# Patient Record
Sex: Female | Born: 1965 | Race: White | Hispanic: No | Marital: Single | Smoking: Current every day smoker
Health system: Southern US, Community
[De-identification: ages and names within clinical notes are randomized; demographics above are authoritative.]

## PROBLEM LIST (undated history)

## (undated) DIAGNOSIS — K759 Inflammatory liver disease, unspecified: Secondary | ICD-10-CM

## (undated) HISTORY — DX: Inflammatory liver disease, unspecified: K75.9

## (undated) HISTORY — PX: NO PAST SURGERIES: SHX2092

---

## 2017-12-24 ENCOUNTER — Other Ambulatory Visit: Payer: Self-pay

## 2017-12-24 ENCOUNTER — Emergency Department (HOSPITAL_COMMUNITY): Payer: Medicare Other

## 2017-12-24 ENCOUNTER — Encounter (HOSPITAL_COMMUNITY): Payer: Self-pay | Admitting: Emergency Medicine

## 2017-12-24 ENCOUNTER — Observation Stay (HOSPITAL_COMMUNITY)
Admission: EM | Admit: 2017-12-24 | Discharge: 2017-12-26 | Disposition: A | Discharge status: Home / Self Care | Payer: Medicare Other | Attending: Family Medicine | Admitting: Family Medicine

## 2017-12-24 DIAGNOSIS — T6591XA Toxic effect of unspecified substance, accidental (unintentional), initial encounter: Secondary | ICD-10-CM | POA: Diagnosis not present

## 2017-12-24 DIAGNOSIS — R413 Other amnesia: Secondary | ICD-10-CM | POA: Diagnosis not present

## 2017-12-24 DIAGNOSIS — F1910 Other psychoactive substance abuse, uncomplicated: Secondary | ICD-10-CM | POA: Diagnosis not present

## 2017-12-24 DIAGNOSIS — N179 Acute kidney failure, unspecified: Secondary | ICD-10-CM | POA: Diagnosis present

## 2017-12-24 DIAGNOSIS — D649 Anemia, unspecified: Secondary | ICD-10-CM | POA: Insufficient documentation

## 2017-12-24 DIAGNOSIS — D72829 Elevated white blood cell count, unspecified: Secondary | ICD-10-CM | POA: Diagnosis not present

## 2017-12-24 DIAGNOSIS — N390 Urinary tract infection, site not specified: Secondary | ICD-10-CM | POA: Insufficient documentation

## 2017-12-24 DIAGNOSIS — Y92003 Bedroom of unspecified non-institutional (private) residence as the place of occurrence of the external cause: Secondary | ICD-10-CM | POA: Diagnosis not present

## 2017-12-24 DIAGNOSIS — D696 Thrombocytopenia, unspecified: Secondary | ICD-10-CM | POA: Diagnosis present

## 2017-12-24 DIAGNOSIS — T50901A Poisoning by unspecified drugs, medicaments and biological substances, accidental (unintentional), initial encounter: Secondary | ICD-10-CM

## 2017-12-24 DIAGNOSIS — G9340 Encephalopathy, unspecified: Secondary | ICD-10-CM | POA: Diagnosis present

## 2017-12-24 LAB — I-STAT CHEM 8, ED
BUN: 18 mg/dL (ref 6–20)
Calcium, Ion: 1.1 mmol/L — ABNORMAL LOW (ref 1.15–1.40)
Chloride: 108 mmol/L (ref 98–111)
Creatinine, Ser: 1.2 mg/dL — ABNORMAL HIGH (ref 0.44–1.00)
Glucose, Bld: 179 mg/dL — ABNORMAL HIGH (ref 70–99)
HCT: 37 % (ref 36.0–46.0)
Hemoglobin: 12.6 g/dL (ref 12.0–15.0)
Potassium: 3.9 mmol/L (ref 3.5–5.1)
Sodium: 141 mmol/L (ref 135–145)
TCO2: 24 mmol/L (ref 22–32)

## 2017-12-24 LAB — URINALYSIS, ROUTINE W REFLEX MICROSCOPIC
Bilirubin Urine: NEGATIVE
Glucose, UA: NEGATIVE mg/dL
Hgb urine dipstick: NEGATIVE
Ketones, ur: 5 mg/dL — AB
Nitrite: NEGATIVE
Protein, ur: 30 mg/dL — AB
Specific Gravity, Urine: 1.021 (ref 1.005–1.030)
pH: 5 (ref 5.0–8.0)

## 2017-12-24 LAB — RAPID URINE DRUG SCREEN, HOSP PERFORMED
Amphetamines: NOT DETECTED
Barbiturates: NOT DETECTED
Benzodiazepines: POSITIVE — AB
Cocaine: NOT DETECTED
Opiates: POSITIVE — AB
Tetrahydrocannabinol: NOT DETECTED

## 2017-12-24 LAB — CBC WITH DIFFERENTIAL/PLATELET
Abs Immature Granulocytes: 0.11 10*3/uL — ABNORMAL HIGH (ref 0.00–0.07)
Basophils Absolute: 0 10*3/uL (ref 0.0–0.1)
Basophils Relative: 0 %
Eosinophils Absolute: 0.1 10*3/uL (ref 0.0–0.5)
Eosinophils Relative: 1 %
HCT: 40.3 % (ref 36.0–46.0)
Hemoglobin: 12.4 g/dL (ref 12.0–15.0)
Immature Granulocytes: 1 %
Lymphocytes Relative: 7 %
Lymphs Abs: 0.8 10*3/uL (ref 0.7–4.0)
MCH: 30 pg (ref 26.0–34.0)
MCHC: 30.8 g/dL (ref 30.0–36.0)
MCV: 97.6 fL (ref 80.0–100.0)
Monocytes Absolute: 0.5 10*3/uL (ref 0.1–1.0)
Monocytes Relative: 4 %
Neutro Abs: 10.2 10*3/uL — ABNORMAL HIGH (ref 1.7–7.7)
Neutrophils Relative %: 87 %
Platelets: 113 10*3/uL — ABNORMAL LOW (ref 150–400)
RBC: 4.13 MIL/uL (ref 3.87–5.11)
RDW: 14.2 % (ref 11.5–15.5)
WBC: 11.7 10*3/uL — ABNORMAL HIGH (ref 4.0–10.5)
nRBC: 0 % (ref 0.0–0.2)

## 2017-12-24 LAB — CBG MONITORING, ED: Glucose-Capillary: 182 mg/dL — ABNORMAL HIGH (ref 70–99)

## 2017-12-24 LAB — I-STAT TROPONIN, ED: Troponin i, poc: 0 ng/mL (ref 0.00–0.08)

## 2017-12-24 LAB — I-STAT BETA HCG BLOOD, ED (MC, WL, AP ONLY): I-stat hCG, quantitative: <5 m[IU]/mL (ref <5)

## 2017-12-24 LAB — I-STAT CG4 LACTIC ACID, ED: Lactic Acid, Venous: 2.4 mmol/L (ref 0.5–1.9)

## 2017-12-24 LAB — SALICYLATE LEVEL: Salicylate Lvl: <7 mg/dL (ref 2.8–30.0)

## 2017-12-24 LAB — ACETAMINOPHEN LEVEL: Acetaminophen (Tylenol), Serum: <10 ug/mL — ABNORMAL LOW (ref 10–30)

## 2017-12-24 LAB — ETHANOL: Alcohol, Ethyl (B): <10 mg/dL (ref <10)

## 2017-12-24 MED ORDER — LORAZEPAM 2 MG/ML IJ SOLN
INTRAMUSCULAR | Status: AC
  Administered 2017-12-25
  Filled 2017-12-24: qty 1

## 2017-12-24 MED ORDER — HALOPERIDOL LACTATE 5 MG/ML IJ SOLN
INTRAMUSCULAR | Status: AC
  Filled 2017-12-24: qty 1

## 2017-12-24 MED ORDER — LORAZEPAM 2 MG/ML IJ SOLN
INTRAMUSCULAR | Status: AC
  Administered 2017-12-24: 2 mg
  Filled 2017-12-24: qty 1

## 2017-12-24 MED ORDER — SODIUM CHLORIDE 0.9 % IV SOLN
1.0000 g | Freq: Once | INTRAVENOUS | Status: AC
  Administered 2017-12-25: 1 g via INTRAVENOUS
  Filled 2017-12-24: qty 10

## 2017-12-24 MED ORDER — HALOPERIDOL LACTATE 5 MG/ML IJ SOLN
5.0000 mg | Freq: Once | INTRAMUSCULAR | Status: AC
  Administered 2017-12-24: 5 mg via INTRAVENOUS

## 2017-12-24 MED ORDER — NALOXONE HCL 2 MG/2ML IJ SOSY
PREFILLED_SYRINGE | INTRAMUSCULAR | Status: AC
  Administered 2017-12-24: 2 mg
  Filled 2017-12-24: qty 2

## 2017-12-24 MED ORDER — SODIUM CHLORIDE 0.9 % IV BOLUS
500.0000 mL | Freq: Once | INTRAVENOUS | Status: AC
  Administered 2017-12-24: 500 mL via INTRAVENOUS

## 2017-12-24 NOTE — ED Notes (Signed)
Due to patient's bariatric habitus/gurney cuffs applied instead of soft restraints

## 2017-12-24 NOTE — ED Notes (Signed)
Notified EDP,Zammit,MD. And RN,Bobby made aware of pt. CG4 Lactic acid results 2.40.

## 2017-12-24 NOTE — ED Triage Notes (Signed)
Pt found down at home in her own emesis suspected overdose report per EMS that pt overdosed on iunknown substance

## 2017-12-24 NOTE — ED Notes (Signed)
CT notified that pt is approprate for scanning at this time.

## 2017-12-24 NOTE — ED Notes (Signed)
Bed: RESB Expected date:  Expected time:  Means of arrival:  Comments: EMS overdose RR 60 with NPA/NRB zanidine and track marks

## 2017-12-24 NOTE — ED Notes (Signed)
Patient transported to CT 

## 2017-12-25 ENCOUNTER — Observation Stay (HOSPITAL_COMMUNITY)
Admit: 2017-12-25 | Discharge: 2017-12-25 | Disposition: A | Discharge status: Home / Self Care | Payer: Medicare Other | Attending: Internal Medicine | Admitting: Internal Medicine

## 2017-12-25 ENCOUNTER — Other Ambulatory Visit: Payer: Self-pay

## 2017-12-25 ENCOUNTER — Encounter (HOSPITAL_COMMUNITY): Payer: Self-pay | Admitting: Internal Medicine

## 2017-12-25 DIAGNOSIS — G9340 Encephalopathy, unspecified: Secondary | ICD-10-CM | POA: Diagnosis present

## 2017-12-25 DIAGNOSIS — D696 Thrombocytopenia, unspecified: Secondary | ICD-10-CM | POA: Diagnosis present

## 2017-12-25 DIAGNOSIS — N179 Acute kidney failure, unspecified: Secondary | ICD-10-CM | POA: Diagnosis present

## 2017-12-25 DIAGNOSIS — T6591XA Toxic effect of unspecified substance, accidental (unintentional), initial encounter: Secondary | ICD-10-CM | POA: Diagnosis not present

## 2017-12-25 DIAGNOSIS — T50901A Poisoning by unspecified drugs, medicaments and biological substances, accidental (unintentional), initial encounter: Secondary | ICD-10-CM | POA: Diagnosis present

## 2017-12-25 LAB — CBC WITH DIFFERENTIAL/PLATELET
Abs Immature Granulocytes: 0.12 10*3/uL — ABNORMAL HIGH (ref 0.00–0.07)
Basophils Absolute: 0 10*3/uL (ref 0.0–0.1)
Basophils Relative: 0 %
Eosinophils Absolute: 0 10*3/uL (ref 0.0–0.5)
Eosinophils Relative: 0 %
HCT: 38.3 % (ref 36.0–46.0)
Hemoglobin: 11.9 g/dL — ABNORMAL LOW (ref 12.0–15.0)
Immature Granulocytes: 1 %
Lymphocytes Relative: 6 %
Lymphs Abs: 0.8 10*3/uL (ref 0.7–4.0)
MCH: 30.7 pg (ref 26.0–34.0)
MCHC: 31.1 g/dL (ref 30.0–36.0)
MCV: 98.7 fL (ref 80.0–100.0)
Monocytes Absolute: 0.5 10*3/uL (ref 0.1–1.0)
Monocytes Relative: 3 %
Neutro Abs: 12.8 10*3/uL — ABNORMAL HIGH (ref 1.7–7.7)
Neutrophils Relative %: 90 %
Platelets: 142 10*3/uL — ABNORMAL LOW (ref 150–400)
RBC: 3.88 MIL/uL (ref 3.87–5.11)
RDW: 14 % (ref 11.5–15.5)
WBC: 14.3 10*3/uL — ABNORMAL HIGH (ref 4.0–10.5)
nRBC: 0 % (ref 0.0–0.2)

## 2017-12-25 LAB — BLOOD GAS, ARTERIAL
Acid-Base Excess: 0.2 mmol/L (ref 0.0–2.0)
Bicarbonate: 24.1 mmol/L (ref 20.0–28.0)
Drawn by: 23532
FIO2: 21
O2 Saturation: 78.2 %
Patient temperature: 98.6
pCO2 arterial: 38.3 mmHg (ref 32.0–48.0)
pH, Arterial: 7.414 (ref 7.350–7.450)
pO2, Arterial: 42.1 mmHg — ABNORMAL LOW (ref 83.0–108.0)

## 2017-12-25 LAB — COMPREHENSIVE METABOLIC PANEL
ALT: 20 U/L (ref 0–44)
AST: 25 U/L (ref 15–41)
Albumin: 2.9 g/dL — ABNORMAL LOW (ref 3.5–5.0)
Alkaline Phosphatase: 65 U/L (ref 38–126)
Anion gap: 12 (ref 5–15)
BUN: 17 mg/dL (ref 6–20)
CO2: 22 mmol/L (ref 22–32)
Calcium: 8 mg/dL — ABNORMAL LOW (ref 8.9–10.3)
Chloride: 98 mmol/L (ref 98–111)
Creatinine, Ser: 1.05 mg/dL — ABNORMAL HIGH (ref 0.44–1.00)
GFR calc Af Amer: >60 mL/min (ref >60)
GFR calc non Af Amer: 60 mL/min — ABNORMAL LOW (ref >60)
Glucose, Bld: 130 mg/dL — ABNORMAL HIGH (ref 70–99)
Potassium: 4.1 mmol/L (ref 3.5–5.1)
Sodium: 132 mmol/L — ABNORMAL LOW (ref 135–145)
Total Bilirubin: 0.5 mg/dL (ref 0.3–1.2)
Total Protein: 6 g/dL — ABNORMAL LOW (ref 6.5–8.1)

## 2017-12-25 LAB — GLUCOSE, CAPILLARY: Glucose-Capillary: 84 mg/dL (ref 70–99)

## 2017-12-25 LAB — PATHOLOGIST SMEAR REVIEW

## 2017-12-25 LAB — TROPONIN I
Troponin I: 0.04 ng/mL
Troponin I: 0.03 ng/mL
Troponin I: <0.03 ng/mL

## 2017-12-25 LAB — HEPATIC FUNCTION PANEL
ALT: 24 U/L (ref 0–44)
AST: 31 U/L (ref 15–41)
Albumin: 3.3 g/dL — ABNORMAL LOW (ref 3.5–5.0)
Alkaline Phosphatase: 75 U/L (ref 38–126)
Bilirubin, Direct: 0.2 mg/dL (ref 0.0–0.2)
Indirect Bilirubin: 0.4 mg/dL (ref 0.3–0.9)
Total Bilirubin: 0.6 mg/dL (ref 0.3–1.2)
Total Protein: 6.8 g/dL (ref 6.5–8.1)

## 2017-12-25 LAB — LACTIC ACID, PLASMA
Lactic Acid, Venous: 2.1 mmol/L (ref 0.5–1.9)
Lactic Acid, Venous: 1.7 mmol/L (ref 0.5–1.9)

## 2017-12-25 LAB — MAGNESIUM: Magnesium: 1.7 mg/dL (ref 1.7–2.4)

## 2017-12-25 LAB — BASIC METABOLIC PANEL
Anion gap: 10 (ref 5–15)
BUN: 19 mg/dL (ref 6–20)
CO2: 24 mmol/L (ref 22–32)
Calcium: 8.7 mg/dL — ABNORMAL LOW (ref 8.9–10.3)
Chloride: 105 mmol/L (ref 98–111)
Creatinine, Ser: 1.13 mg/dL — ABNORMAL HIGH (ref 0.44–1.00)
GFR calc Af Amer: >60 mL/min (ref >60)
GFR calc non Af Amer: 55 mL/min — ABNORMAL LOW (ref >60)
Glucose, Bld: 137 mg/dL — ABNORMAL HIGH (ref 70–99)
Potassium: 4.4 mmol/L (ref 3.5–5.1)
Sodium: 139 mmol/L (ref 135–145)

## 2017-12-25 LAB — AMMONIA: Ammonia: 28 umol/L (ref 9–35)

## 2017-12-25 LAB — TSH: TSH: 4.478 u[IU]/mL (ref 0.350–4.500)

## 2017-12-25 LAB — HEMOGLOBIN A1C
Hgb A1c MFr Bld: 4.9 % (ref 4.8–5.6)
Mean Plasma Glucose: 93.93 mg/dL

## 2017-12-25 LAB — MRSA PCR SCREENING: MRSA by PCR: NEGATIVE

## 2017-12-25 LAB — HIV ANTIBODY (ROUTINE TESTING W REFLEX): HIV Screen 4th Generation wRfx: NONREACTIVE

## 2017-12-25 MED ORDER — FOLIC ACID 5 MG/ML IJ SOLN
1.0000 mg | Freq: Every day | INTRAMUSCULAR | Status: DC
  Administered 2017-12-25: 1 mg via INTRAVENOUS
  Filled 2017-12-25 (×2): qty 0.2

## 2017-12-25 MED ORDER — THIAMINE HCL 100 MG/ML IJ SOLN
100.0000 mg | Freq: Every day | INTRAMUSCULAR | Status: DC
  Administered 2017-12-25: 100 mg via INTRAVENOUS
  Filled 2017-12-25: qty 2

## 2017-12-25 MED ORDER — SODIUM CHLORIDE 0.9 % IV SOLN
1.0000 g | INTRAVENOUS | Status: DC
  Administered 2017-12-25: 1 g via INTRAVENOUS
  Filled 2017-12-25: qty 1

## 2017-12-25 MED ORDER — SODIUM CHLORIDE 0.9 % IV BOLUS
1000.0000 mL | Freq: Once | INTRAVENOUS | Status: AC
  Administered 2017-12-25: 1000 mL via INTRAVENOUS

## 2017-12-25 MED ORDER — ACETAMINOPHEN 325 MG PO TABS: 650.0000 mg | ORAL_TABLET | Freq: Four times a day (QID) | ORAL | Status: DC | PRN

## 2017-12-25 MED ORDER — LORAZEPAM 2 MG/ML IJ SOLN: 2.0000 mg | INTRAMUSCULAR | Status: DC | PRN

## 2017-12-25 MED ORDER — SODIUM CHLORIDE 0.9 % IV SOLN
INTRAVENOUS | Status: AC
  Administered 2017-12-25 (×3): via INTRAVENOUS

## 2017-12-25 MED ORDER — ACETAMINOPHEN 650 MG RE SUPP: 650.0000 mg | Freq: Four times a day (QID) | RECTAL | Status: DC | PRN

## 2017-12-25 NOTE — ED Notes (Signed)
Pt to CT scan via stretcher medicated with ativan 2 mg IV for agitation

## 2017-12-25 NOTE — Progress Notes (Signed)
EEG complete - results pending 

## 2017-12-25 NOTE — Progress Notes (Signed)
PROGRESS NOTE    Jade Burgess  ZOX:[id number] DOB: 12/20/1965 DOA: 12/24/2017 PCP: Patient, No Pcp Per      Brief Narrative:  Jade Burgess is a 52 y.o. F with hx IVDU presented with being found down, unconscious.  Per notes, EMS gave Narcan after which she woke up, was aggressive, given Haldol.  UDS positive for opiates, benzos only.  Aceta/salic/alcohol negative.  Lactic acid elevated, WBC 11.7K only, no fever, no reports of recent malaise, illness.  UA non-descript, CXR clear.   Of note, patient's boyfriend found at same address, deceased from apparent overdose.     Assessment & Plan:  Suspected overdose Patient and son deny suicidal ideation.   Patient also however denies overdose.  She does not know about her boyfriend's death, son has requested that he be able to break this news later. -Monitor on tele -Continue IV fluids and supportive care -Consult to Psych for safety eval given overdose   Leukocytosis Presumed reactive. At this point, no reason to suspect infection.  Substance use disorder -Continue CIWA -Continue folate, thiamine     DVT prophylaxis: SCDs Code Status: FULL Family Communication: Son at bedside MDM and disposition Plan: This is a no charge note.  For further details, please see H&P by my partner Dr. Toniann Fail from earlier today.  The below labs and imaging reports were reviewed and summarized above.    The patient was admitted with acute change in mentation.  Suspected overdose.  Will observe overnight, if normal tomorrow, no developing signs of infection, and able to contract for safety with Psych, will dishcarge tomorrow.    Objective: Vitals:   12/25/17 0500 12/25/17 0600 12/25/17 0700 12/25/17 0800  BP: 117/77 122/69 (!) 102/50 (!) 117/59  Pulse: 80 83 77 79  Resp: (!) 28 (!) 28 (!) 24 (!) 24  Temp: 98.2 F (36.8 C) 98.4 F (36.9 C) 98.6 F (37 C) 98.4 F (36.9 C)  TempSrc:    Core  SpO2: 96% 95% 96% 96%  Weight:      Height:          Intake/Output Summary (Last 24 hours) at 12/25/2017 0921 Last data filed at 12/25/2017 0600 Gross per 24 hour  Intake 408.56 ml  Output 475 ml  Net -66.44 ml   Filed Weights   12/25/17 0300  Weight: 117.8 kg    Examination: The patient was seen and examined.      Data Reviewed: I have personally reviewed following labs and imaging studies:  CBC: Recent Labs  Lab 12/24/17 2256 12/24/17 2306 12/25/17 0250  WBC 11.7*  --  14.3*  NEUTROABS 10.2*  --  12.8*  HGB 12.4 12.6 11.9*  HCT 40.3 37.0 38.3  MCV 97.6  --  98.7  PLT 113*  --  142*   Basic Metabolic Panel: Recent Labs  Lab 12/24/17 2256 12/24/17 2306 12/25/17 0250  NA 132* 141 139  K 4.1 3.9 4.4  CL 98 108 105  CO2 22  --  24  GLUCOSE 130* 179* 137*  BUN 17 18 19   CREATININE 1.05* 1.20* 1.13*  CALCIUM 8.0*  --  8.7*  MG  --   --  1.7   GFR: Estimated Creatinine Clearance: 71 mL/min (A) (by C-G formula based on SCr of 1.13 mg/dL (H)). Liver Function Tests: Recent Labs  Lab 12/24/17 2256 12/25/17 0250  AST 25 31  ALT 20 24  ALKPHOS 65 75  BILITOT 0.5 0.6  PROT 6.0* 6.8  ALBUMIN 2.9*  3.3*   No results for input(s): LIPASE, AMYLASE in the last 168 hours. No results for input(s): AMMONIA in the last 168 hours. Coagulation Profile: No results for input(s): INR, PROTIME in the last 168 hours. Cardiac Enzymes: Recent Labs  Lab 12/25/17 0250  TROPONINI 0.04*   BNP (last 3 results) No results for input(s): PROBNP in the last 8760 hours. HbA1C: Recent Labs    12/25/17 0250  HGBA1C 4.9   CBG: Recent Labs  Lab 12/24/17 2217  GLUCAP 182*   Lipid Profile: No results for input(s): CHOL, HDL, LDLCALC, TRIG, CHOLHDL, LDLDIRECT in the last 72 hours. Thyroid Function Tests: Recent Labs    12/25/17 0250  TSH 4.478   Anemia Panel: No results for input(s): VITAMINB12, FOLATE, FERRITIN, TIBC, IRON, RETICCTPCT in the last 72 hours. Urine analysis:    Component Value Date/Time   COLORURINE  AMBER (A) 12/24/2017 2226   APPEARANCEUR HAZY (A) 12/24/2017 2226   LABSPEC 1.021 12/24/2017 2226   PHURINE 5.0 12/24/2017 2226   GLUCOSEU NEGATIVE 12/24/2017 2226   HGBUR NEGATIVE 12/24/2017 2226   BILIRUBINUR NEGATIVE 12/24/2017 2226   KETONESUR 5 (A) 12/24/2017 2226   PROTEINUR 30 (A) 12/24/2017 2226   NITRITE NEGATIVE 12/24/2017 2226   LEUKOCYTESUR MODERATE (A) 12/24/2017 2226   Sepsis Labs: @LABRCNTIP (procalcitonin:4,lacticacidven:4)  ) Recent Results (from the past 240 hour(s))  MRSA PCR Screening     Status: None   Collection Time: 12/25/17  2:19 AM  Result Value Ref Range Status   MRSA by PCR NEGATIVE NEGATIVE Final    Comment:        The GeneXpert MRSA Assay (FDA approved for NASAL specimens only), is one component of a comprehensive MRSA colonization surveillance program. It is not intended to diagnose MRSA infection nor to guide or monitor treatment for MRSA infections. Performed at [facility], [address] [place], [place] [address]          Radiology Studies: Ct Head Wo Contrast  Result Date: 12/25/2017 CLINICAL DATA:  Patient was found down. Possible overdose. EXAM: CT HEAD WITHOUT CONTRAST TECHNIQUE: Contiguous axial images were obtained from the base of the skull through the vertex without intravenous contrast. COMPARISON:  None. FINDINGS: Brain: Mild cerebral atrophy for age. No ventricular dilatation. No mass effect or midline shift. No abnormal extra-axial fluid collections. Gray-white matter junctions are distinct. Basal cisterns are not effaced. No acute intracranial hemorrhage. Vascular: No hyperdense vessel or unexpected calcification. Skull: Calvarium appears intact. Sinuses/Orbits: Paranasal sinuses demonstrate mild mucosal thickening in the maxillary antra. No acute air-fluid levels. Hypoaeration of the mastoid air cells without opacification. Other: None. IMPRESSION: No acute intracranial abnormalities.  Mild cerebral  atrophy. Electronically Signed   By: Burman Nieves M.D.   On: 12/25/2017 00:35   Dg Chest Port 1 View  Result Date: 12/24/2017 CLINICAL DATA:  Patient found down at home.  Suspected overdose. EXAM: PORTABLE CHEST 1 VIEW COMPARISON:  None. FINDINGS: Shallow inspiration. Normal heart size and pulmonary vascularity. No focal airspace disease or consolidation in the lungs. No blunting of costophrenic angles. No pneumothorax. Mediastinal contours appear intact. IMPRESSION: No active disease. Electronically Signed   By: Burman Nieves M.D.   On: 12/24/2017 23:04        Scheduled Meds: . folic acid  1 mg Intravenous Daily  . haloperidol lactate      . thiamine injection  100 mg Intravenous Daily   Continuous Infusions: . sodium chloride 125 mL/hr at 12/25/17 0600  . cefTRIAXone (  ROCEPHIN)  IV       LOS: 0 days    Time spent: 15 minutes    Alberteen Sam, MD [facility] 12/25/2017, 9:21 AM     Pager [phone] --- please page though AMION:  [email or web address] If 7PM-7AM, please contact night-coverage

## 2017-12-25 NOTE — ED Provider Notes (Signed)
[facility]-EMERGENCY DEPT Provider Note   CSN: [id number] Arrival date & time: 12/24/17  2203     History   Chief Complaint Chief Complaint  Patient presents with  . Drug Overdose    HPI Jade Burgess is a 52 y.o. female.  Patient is a known IV drug user.  Her son found her obtunded in the bed and she had vomited.  Additional history when the son went back to the house to try to find the patient's driver's license he found the boyfriend unresponsive in a car.  He had overdosed in the car and resuscitated efforts failed and the boyfriend died  The history is provided by a relative. No language interpreter was used.  Altered Mental Status   This is a new problem. Episode onset: Unknown. The problem has not changed since onset.Associated symptoms include confusion. Risk factors include illicit drug use. Her past medical history does not include seizures.    History reviewed. No pertinent past medical history.  There are no active problems to display for this patient.   History reviewed. No pertinent surgical history.   OB History   None      Home Medications    Prior to Admission medications   Not on File    Family History History reviewed. No pertinent family history.  Social History Social History   Tobacco Use  . Smoking status: Unknown If Ever Smoked  Substance Use Topics  . Alcohol use: Not on file  . Drug use: Not on file     Allergies   Patient has no known allergies.   Review of Systems Review of Systems  Unable to perform ROS: Mental status change  Psychiatric/Behavioral: Positive for confusion.     Physical Exam Updated Vital Signs BP (!) 160/86   Pulse (!) 113   Temp 97.7 F (36.5 C)   Resp (!) 25   LMP  (LMP Unknown)   SpO2 97%   Physical Exam  Constitutional: She appears well-developed.  HENT:  Head: Normocephalic.  Eyes: Conjunctivae and EOM are normal. No scleral icterus.  Neck: Neck supple. No  thyromegaly present.  Cardiovascular: Regular rhythm. Exam reveals no gallop and no friction rub.  No murmur heard. Tachycardic  Pulmonary/Chest: No stridor. She has no wheezes. She has no rales. She exhibits no tenderness.  Few crackles throughout  Abdominal: She exhibits no distension. There is no tenderness. There is no rebound.  Musculoskeletal: Normal range of motion. She exhibits no edema.  Lymphadenopathy:    She has no cervical adenopathy.  Neurological: She exhibits normal muscle tone. Coordination normal.  Patient extremely confused.  She is screaming and moving all her extremities.  Patient will not follow commands at all.  Patient is protecting her airway.  Skin: No rash noted. No erythema.     ED Treatments / Results  Labs (all labs ordered are listed, but only abnormal results are displayed) Labs Reviewed  CBC WITH DIFFERENTIAL/PLATELET - Abnormal; Notable for the following components:      Result Value   WBC 11.7 (*)    Platelets 113 (*)    Neutro Abs 10.2 (*)    Abs Immature Granulocytes 0.11 (*)    All other components within normal limits  COMPREHENSIVE METABOLIC PANEL - Abnormal; Notable for the following components:   Sodium 132 (*)    Glucose, Bld 130 (*)    Creatinine, Ser 1.05 (*)    Calcium 8.0 (*)    Total Protein 6.0 (*)  Albumin 2.9 (*)    GFR calc non Af Amer 60 (*)    All other components within normal limits  URINALYSIS, ROUTINE W REFLEX MICROSCOPIC - Abnormal; Notable for the following components:   Color, Urine AMBER (*)    APPearance HAZY (*)    Ketones, ur 5 (*)    Protein, ur 30 (*)    Leukocytes, UA MODERATE (*)    Bacteria, UA FEW (*)    All other components within normal limits  RAPID URINE DRUG SCREEN, HOSP PERFORMED - Abnormal; Notable for the following components:   Opiates POSITIVE (*)    Benzodiazepines POSITIVE (*)    All other components within normal limits  ACETAMINOPHEN LEVEL - Abnormal; Notable for the following  components:   Acetaminophen (Tylenol), Serum <10 (*)    All other components within normal limits  I-STAT CHEM 8, ED - Abnormal; Notable for the following components:   Creatinine, Ser 1.20 (*)    Glucose, Bld 179 (*)    Calcium, Ion 1.10 (*)    All other components within normal limits  I-STAT CG4 LACTIC ACID, ED - Abnormal; Notable for the following components:   Lactic Acid, Venous 2.40 (*)    All other components within normal limits  CBG MONITORING, ED - Abnormal; Notable for the following components:   Glucose-Capillary 182 (*)    All other components within normal limits  URINE CULTURE  ETHANOL  SALICYLATE LEVEL  I-STAT TROPONIN, ED  I-STAT BETA HCG BLOOD, ED ([facility], [facility], [facility] ONLY)  I-STAT CG4 LACTIC ACID, ED    EKG None  Radiology Dg Chest Port 1 View  Result Date: 12/24/2017 CLINICAL DATA:  Patient found down at home.  Suspected overdose. EXAM: PORTABLE CHEST 1 VIEW COMPARISON:  None. FINDINGS: Shallow inspiration. Normal heart size and pulmonary vascularity. No focal airspace disease or consolidation in the lungs. No blunting of costophrenic angles. No pneumothorax. Mediastinal contours appear intact. IMPRESSION: No active disease. Electronically Signed   By: Burman Nieves M.D.   On: 12/24/2017 23:04    Procedures Procedures (including critical care time)  Medications Ordered in ED Medications  haloperidol lactate (HALDOL) 5 MG/ML injection (  Not Given 12/24/17 2315)  cefTRIAXone (ROCEPHIN) 1 g in sodium chloride 0.9 % 100 mL IVPB (has no administration in time range)  LORazepam (ATIVAN) 2 MG/ML injection (has no administration in time range)  sodium chloride 0.9 % bolus 1,000 mL (has no administration in time range)  naloxone [facility]) 2 MG/2ML injection (2 mg  Given 12/24/17 2208)  sodium chloride 0.9 % bolus 500 mL (500 mLs Intravenous New Bag/Given 12/24/17 2253)  LORazepam (ATIVAN) 2 MG/ML injection (2 mg  Given 12/24/17 2229)  haloperidol lactate (HALDOL)  injection 5 mg (5 mg Intravenous Given 12/24/17 2241)  LORazepam (ATIVAN) 2 MG/ML injection (2 mg  Given 12/24/17 2315)     Initial Impression / Assessment and Plan / ED Course  I have reviewed the triage vital signs and the nursing notes.  Pertinent labs & imaging results that were available during my care of the patient were reviewed by me and considered in my medical decision making (see chart for details).     CRITICAL CARE Performed by: Bethann Berkshire Total critical care time: 70 minutes Critical care time was exclusive of separately billable procedures and treating other patients. Critical care was necessary to treat or prevent imminent or life-threatening deterioration. Critical care was time spent personally by me on the following activities: development of treatment plan  with patient and/or surrogate as well as nursing, discussions with consultants, evaluation of patient's response to treatment, examination of patient, obtaining history from patient or surrogate, ordering and performing treatments and interventions, ordering and review of laboratory studies, ordering and review of radiographic studies, pulse oximetry and re-evaluation of patient's condition        patient was given approximately 6 mg of Ativan and 5 mg of Haldol to sedate her such as she can get her labs and CT scans done.  Labs are unremarkable with lactic acid 2.4.  Urinalysis is positive for benzos and opiates patient also has a urinary tract infection.  Chest x-ray unremarkable.  CT scan pending.  Patient with urinary tract infection and confusion from most likely drug overdose.  She will be admitted to the ICU Final Clinical Impressions(s) / ED Diagnoses   Final diagnoses:  None    ED Discharge Orders    None       Bethann Berkshire, MD 12/25/17 1232

## 2017-12-25 NOTE — Care Management Note (Signed)
Case Management Note  Patient Details  Name: Mykelle Cockerell MRN: [id number] Date of Birth: 04-12-1965  Subjective/Objective:        Jarratt at 21%,tropopin 0.24,wcb 14.3, lactic acid 2.1, iv ns at 125cc/hrs, iv rocephin            Action/Plan: Following for cm needs  Expected Discharge Date:  (unknown)               Expected Discharge Plan:  Home/Self Care  In-House Referral:     Discharge planning Services  CM Consult  Post Acute Care Choice:    Choice offered to:     DME Arranged:    DME Agency:     HH Arranged:    HH Agency:     Status of Service:  In process, will continue to follow  If discussed at Long Length of Stay Meetings, dates discussed:    Additional Comments:  Golda Acre, RN 12/25/2017, 9:57 AM

## 2017-12-25 NOTE — H&P (Addendum)
History and Physical    Jade Burgess ZOX:[id number] DOB: Apr 29, 1965 DOA: 12/24/2017  PCP: Patient, No Pcp Per   Patient coming from: Home.  History obtained from ER physician and patient's nurse.  Patient is encephalopathic.  No family available at this time no contact number available.  Chief Complaint: Confusion.  HPI: Jade Burgess is a 52 y.o. female with history of IV drug abuse was brought to the ER by patient's son after patient was found to be unconscious at home and was lying on the floor.  Not sure how long patient was lying on the floor.  Patient was brought to the ER after which.  The ER physician stated that when patient's son went back to check patient ID at home patient's boyfriend was lying dead on the driveway.  It is not known what drug the patient or patient's boyfriend was taking.  ED Course: In the ER patient became more aggressive and encephalopathic.  Patient was initially given Narcan but has to be soon given Haldol and at least 6 mg of IV Ativan for patient becoming more aggressive.  CT head was unremarkable patient was afebrile.  Urine drug screen is positive for opiates and benzodiazepine.  EKG is pending.  Acetaminophen level and salicylate levels were negative.  Lab work show mild leukocytosis and thrombocytopenia and mildly elevated creatinine.  No other labs to compare.  Albumin level is low.  Chest x-ray is unremarkable.  UA shows features concerning for UTI.  Review of Systems: As per HPI, rest all negative.   History reviewed. No pertinent past medical history.  History reviewed. No pertinent surgical history.   has an unknown smoking status. She has never used smokeless tobacco. Her alcohol and drug histories are not on file.  No Known Allergies  Family History  Family history unknown: Yes    Prior to Admission medications   Not on File    Physical Exam: Vitals:   12/25/17 0000 12/25/17 0030 12/25/17 0100 12/25/17 0130  BP: (!) 145/103  120/80 131/71 128/69  Pulse: (!) 113 89 90 92  Resp: (!) 36 (!) 28 (!) 30 (!) 29  Temp:  98.1 F (36.7 C) 98.1 F (36.7 C) 98.2 F (36.8 C)  TempSrc:      SpO2: 97% 91% 91% 93%      Constitutional: Moderately built and nourished. Vitals:   12/25/17 0000 12/25/17 0030 12/25/17 0100 12/25/17 0130  BP: (!) 145/103 120/80 131/71 128/69  Pulse: (!) 113 89 90 92  Resp: (!) 36 (!) 28 (!) 30 (!) 29  Temp:  98.1 F (36.7 C) 98.1 F (36.7 C) 98.2 F (36.8 C)  TempSrc:      SpO2: 97% 91% 91% 93%   Eyes: Anicteric no pallor. ENMT: No discharge from the ears eyes nose or mouth. Neck: No neck rigidity no mass felt. Respiratory: No rhonchi or crepitations. Cardiovascular: S1-S2 heard no murmurs appreciated. Abdomen: Soft nontender bowel sounds present. Musculoskeletal: No edema.  No joint effusion. Skin: No rash. Neurologic: Encephalopathic does not follow commands. Psychiatric: Encephalopathic does not follow commands.   Labs on Admission: I have personally reviewed following labs and imaging studies  CBC: Recent Labs  Lab 12/24/17 2256 12/24/17 2306  WBC 11.7*  --   NEUTROABS 10.2*  --   HGB 12.4 12.6  HCT 40.3 37.0  MCV 97.6  --   PLT 113*  --    Basic Metabolic Panel: Recent Labs  Lab 12/24/17 2256 12/24/17 2306  NA 132* 141  K 4.1 3.9  CL 98 108  CO2 22  --   GLUCOSE 130* 179*  BUN 17 18  CREATININE 1.05* 1.20*  CALCIUM 8.0*  --    GFR: CrCl cannot be calculated (Unknown ideal weight.). Liver Function Tests: Recent Labs  Lab 12/24/17 2256  AST 25  ALT 20  ALKPHOS 65  BILITOT 0.5  PROT 6.0*  ALBUMIN 2.9*   No results for input(s): LIPASE, AMYLASE in the last 168 hours. No results for input(s): AMMONIA in the last 168 hours. Coagulation Profile: No results for input(s): INR, PROTIME in the last 168 hours. Cardiac Enzymes: No results for input(s): CKTOTAL, CKMB, CKMBINDEX, TROPONINI in the last 168 hours. BNP (last 3 results) No results for  input(s): PROBNP in the last 8760 hours. HbA1C: No results for input(s): HGBA1C in the last 72 hours. CBG: Recent Labs  Lab 12/24/17 2217  GLUCAP 182*   Lipid Profile: No results for input(s): CHOL, HDL, LDLCALC, TRIG, CHOLHDL, LDLDIRECT in the last 72 hours. Thyroid Function Tests: No results for input(s): TSH, T4TOTAL, FREET4, T3FREE, THYROIDAB in the last 72 hours. Anemia Panel: No results for input(s): VITAMINB12, FOLATE, FERRITIN, TIBC, IRON, RETICCTPCT in the last 72 hours. Urine analysis:    Component Value Date/Time   COLORURINE AMBER (A) 12/24/2017 2226   APPEARANCEUR HAZY (A) 12/24/2017 2226   LABSPEC 1.021 12/24/2017 2226   PHURINE 5.0 12/24/2017 2226   GLUCOSEU NEGATIVE 12/24/2017 2226   HGBUR NEGATIVE 12/24/2017 2226   BILIRUBINUR NEGATIVE 12/24/2017 2226   KETONESUR 5 (A) 12/24/2017 2226   PROTEINUR 30 (A) 12/24/2017 2226   NITRITE NEGATIVE 12/24/2017 2226   LEUKOCYTESUR MODERATE (A) 12/24/2017 2226   Sepsis Labs: @LABRCNTIP (procalcitonin:4,lacticidven:4) )No results found for this or any previous visit (from the past 240 hour(s)).   Radiological Exams on Admission: Ct Head Wo Contrast  Result Date: 12/25/2017 CLINICAL DATA:  Patient was found down. Possible overdose. EXAM: CT HEAD WITHOUT CONTRAST TECHNIQUE: Contiguous axial images were obtained from the base of the skull through the vertex without intravenous contrast. COMPARISON:  None. FINDINGS: Brain: Mild cerebral atrophy for age. No ventricular dilatation. No mass effect or midline shift. No abnormal extra-axial fluid collections. Gray-white matter junctions are distinct. Basal cisterns are not effaced. No acute intracranial hemorrhage. Vascular: No hyperdense vessel or unexpected calcification. Skull: Calvarium appears intact. Sinuses/Orbits: Paranasal sinuses demonstrate mild mucosal thickening in the maxillary antra. No acute air-fluid levels. Hypoaeration of the mastoid air cells without opacification.  Other: None. IMPRESSION: No acute intracranial abnormalities.  Mild cerebral atrophy. Electronically Signed   By: Burman Nieves M.D.   On: 12/25/2017 00:35   Dg Chest Port 1 View  Result Date: 12/24/2017 CLINICAL DATA:  Patient found down at home.  Suspected overdose. EXAM: PORTABLE CHEST 1 VIEW COMPARISON:  None. FINDINGS: Shallow inspiration. Normal heart size and pulmonary vascularity. No focal airspace disease or consolidation in the lungs. No blunting of costophrenic angles. No pneumothorax. Mediastinal contours appear intact. IMPRESSION: No active disease. Electronically Signed   By: Burman Nieves M.D.   On: 12/24/2017 23:04     Assessment/Plan Principal Problem:   Acute encephalopathy Active Problems:   Drug overdose   ARF (acute renal failure) (HCC)    1. Acute encephalopathy likely drug-induced -since patient became aggressive patient was placed on restraints and was given a total of 6 mg of IV Ativan so far and also Haldol was given.  Patient at this time has been placed on CIWA  protocol.  Will admit to stepdown unit for close monitoring.  Will check ABG repeat labs including LFTs CBC with differential get blood cultures keep patient on empiric antibiotics for UTI.  Will get CT of the C-spine.  EKG.  Check CK levels cardiac markers.  Will need to get further history from patient's son when available.Check EEG I do not think patient is having active seizures since sometimes he responds to her name.  Check ammonia levels. 2. Renal failure likely could be acute no old labs to compare.  Gently hydrating for now. 3. Possible UTI on ceftriaxone.  Follow urine cultures since patient also has IV drug abuse will check blood cultures. 4. Thrombocytopenia -cause not clear.  Follow CBC with differentials closely.  Check smear review.  Given the low albumin level patient probably could have cirrhosis.  ABG EKG blood cultures repeat labs including troponin CK levels TSH hemoglobin A1c and CT  C-spine are pending.  Will need to get further history when family available.  Patient is placed on CIWA protocol for now.   DVT prophylaxis: SCDs for now. Code Status: Full code. Family Communication: No family available at this time. Disposition Plan: To be determined. Consults called: None. Admission status: Observation.   Eduard Clos MD [facility] Pager [phone].  If 7PM-7AM, please contact night-coverage [email or web address] Password [email or web address]  12/25/2017, 2:01 AM

## 2017-12-25 NOTE — Procedures (Signed)
ELECTROENCEPHALOGRAM REPORT   Patient: Jade Burgess       Room #: 1227 EEG No. ID: [id number] Age: 52 y.o.        Sex: female Referring Physician: Danford Report Date:  12/25/2017        Interpreting Physician: Thana Farr  History: [name] is an 52 y.o. female found unresponsive  Medications:  Rocephin, Folic acid, Thiamine  Conditions of Recording:  This is a 21 channel routine scalp EEG performed with bipolar and monopolar montages arranged in accordance to the international 10/20 system of electrode placement. One channel was dedicated to EKG recording.  The patient is in the awake, drowsy and asleep states.  Description:  The waking background activity consists of a low voltage, symmetrical, fairly well organized, 8-9 Hz alpha activity, seen from the parieto-occipital and posterior temporal regions.  Low voltage fast activity, poorly organized, is seen anteriorly and is at times superimposed on more posterior regions.  A mixture of theta and alpha rhythms are seen from the central and temporal regions. The patient drowses with slowing to irregular, low voltage theta and beta activity.   The patient goes in to a light sleep with symmetrical sleep spindles, vertex central sharp transients and irregular slow activity.   No epileptiform activity is noted.   Hyperventilation and intermittent photic stimulation were not performed.   IMPRESSION: Normal electroencephalogram, awake and asleep. There are no focal lateralizing or epileptiform features.   Thana Farr, MD Neurology [phone] 12/25/2017, 12:35 PM

## 2017-12-25 NOTE — ED Notes (Signed)
ED TO INPATIENT HANDOFF REPORT  Name/Age/Gender Jade Burgess 52 y.o. female  Code Status    Code Status Orders  (From admission, onward)         Start     Ordered   12/25/17 0200  Full code  Continuous     12/25/17 0201        Code Status History    This patient has a current code status but no historical code status.      Home/SNF/Other Home  Chief Complaint Acute encephalopathy [G93.40]  Level of Care/Admitting Diagnosis ED Disposition    ED Disposition Condition Comment   Admit  Hospital Area: [facility] [[id number]]  Level of Care: Stepdown [14]  Admit to SDU based on following criteria: Severe physiological/psychological symptoms:  Any diagnosis requiring assessment & intervention at least every 4 hours on an ongoing basis to obtain desired patient outcomes including stability and rehabilitation  Diagnosis: Acute encephalopathy [226333]  Admitting Physician: Rise Patience [phone]  Attending Physician: Rise Patience [[id number]]  PT Class (Do Not Modify): Observation [104]  PT Acc Code (Do Not Modify): Observation [10022]       Medical History History reviewed. No pertinent past medical history.  Allergies No Known Allergies  IV Location/Drains/Wounds Patient Lines/Drains/Airways Status   Active Line/Drains/Airways    Name:   Placement date:   Placement time:   Site:   Days:   Peripheral IV 12/24/17   12/24/17    2223    -   1   Urethral Catheter Terri Doster RN Temperature probe 14 Fr.   12/24/17    2239    Temperature probe   1          Labs/Imaging Results for orders placed or performed during the hospital encounter of 12/24/17 (from the past 48 hour(s))  CBG monitoring, ED     Status: Abnormal   Collection Time: 12/24/17 10:17 PM  Result Value Ref Range   Glucose-Capillary 182 (H) 70 - 99 mg/dL   Comment 1 Notify RN    Comment 2 Document in Chart   Urinalysis, Routine w reflex microscopic     Status: Abnormal   Collection Time: 12/24/17 10:26 PM  Result Value Ref Range   Color, Urine AMBER (A) YELLOW    Comment: BIOCHEMICALS MAY BE AFFECTED BY COLOR   APPearance HAZY (A) CLEAR   Specific Gravity, Urine 1.021 1.005 - 1.030   pH 5.0 5.0 - 8.0   Glucose, UA NEGATIVE NEGATIVE mg/dL   Hgb urine dipstick NEGATIVE NEGATIVE   Bilirubin Urine NEGATIVE NEGATIVE   Ketones, ur 5 (A) NEGATIVE mg/dL   Protein, ur 30 (A) NEGATIVE mg/dL   Nitrite NEGATIVE NEGATIVE   Leukocytes, UA MODERATE (A) NEGATIVE   RBC / HPF 0-5 0 - 5 RBC/hpf   WBC, UA 21-50 0 - 5 WBC/hpf   Bacteria, UA FEW (A) NONE SEEN   Squamous Epithelial / LPF 0-5 0 - 5   WBC Clumps PRESENT    Mucus PRESENT    Hyaline Casts, UA PRESENT     Comment: Performed at [facility], [place] [address], [place], [place] [address]  Rapid urine drug screen (hospital performed)     Status: Abnormal   Collection Time: 12/24/17 10:26 PM  Result Value Ref Range   Opiates POSITIVE (A) NONE DETECTED   Cocaine NONE DETECTED NONE DETECTED   Benzodiazepines POSITIVE (A) NONE DETECTED   Amphetamines NONE DETECTED NONE DETECTED  Tetrahydrocannabinol NONE DETECTED NONE DETECTED   Barbiturates NONE DETECTED NONE DETECTED    Comment: (NOTE) DRUG SCREEN FOR MEDICAL PURPOSES ONLY.  IF CONFIRMATION IS NEEDED FOR ANY PURPOSE, NOTIFY LAB WITHIN 5 DAYS. LOWEST DETECTABLE LIMITS FOR URINE DRUG SCREEN Drug Class                     Cutoff (ng/mL) Amphetamine and metabolites    1000 Barbiturate and metabolites    200 Benzodiazepine                 001 Tricyclics and metabolites     300 Opiates and metabolites        300 Cocaine and metabolites        300 THC                            50 Performed at [facility], [place] [address], [place], [place] [address]   CBC with Differential/Platelet     Status: Abnormal   Collection Time: 12/24/17 10:56 PM  Result Value Ref Range   WBC 11.7 (H) 4.0 - 10.5 K/uL   RBC 4.13 3.87 - 5.11  MIL/uL   Hemoglobin 12.4 12.0 - 15.0 g/dL   HCT 40.3 36.0 - 46.0 %   MCV 97.6 80.0 - 100.0 fL   MCH 30.0 26.0 - 34.0 pg   MCHC 30.8 30.0 - 36.0 g/dL   RDW 14.2 11.5 - 15.5 %   Platelets 113 (L) 150 - 400 K/uL    Comment: Immature Platelet Fraction may be clinically indicated, consider ordering this additional test [id number]    nRBC 0.0 0.0 - 0.2 %   Neutrophils Relative % 87 %   Neutro Abs 10.2 (H) 1.7 - 7.7 K/uL   Lymphocytes Relative 7 %   Lymphs Abs 0.8 0.7 - 4.0 K/uL   Monocytes Relative 4 %   Monocytes Absolute 0.5 0.1 - 1.0 K/uL   Eosinophils Relative 1 %   Eosinophils Absolute 0.1 0.0 - 0.5 K/uL   Basophils Relative 0 %   Basophils Absolute 0.0 0.0 - 0.1 K/uL   Immature Granulocytes 1 %   Abs Immature Granulocytes 0.11 (H) 0.00 - 0.07 K/uL    Comment: Performed at [facility], [place] [address], [place], [place] [address]  Comprehensive metabolic panel     Status: Abnormal   Collection Time: 12/24/17 10:56 PM  Result Value Ref Range   Sodium 132 (L) 135 - 145 mmol/L   Potassium 4.1 3.5 - 5.1 mmol/L   Chloride 98 98 - 111 mmol/L   CO2 22 22 - 32 mmol/L   Glucose, Bld 130 (H) 70 - 99 mg/dL   BUN 17 6 - 20 mg/dL   Creatinine, Ser 1.05 (H) 0.44 - 1.00 mg/dL   Calcium 8.0 (L) 8.9 - 10.3 mg/dL   Total Protein 6.0 (L) 6.5 - 8.1 g/dL   Albumin 2.9 (L) 3.5 - 5.0 g/dL   AST 25 15 - 41 U/L   ALT 20 0 - 44 U/L   Alkaline Phosphatase 65 38 - 126 U/L   Total Bilirubin 0.5 0.3 - 1.2 mg/dL   GFR calc non Af Amer 60 (L) >60 mL/min   GFR calc Af Amer >60 >60 mL/min    Comment: (NOTE) The eGFR has been calculated using the CKD EPI equation. This calculation has not been validated in all clinical situations. eGFR's persistently <60 mL/min  signify possible Chronic Kidney Disease.    Anion gap 12 5 - 15    Comment: Performed at [facility], [place] [address], [place], [place] [address]  Ethanol     Status: None   Collection Time: 12/24/17 10:56  PM  Result Value Ref Range   Alcohol, Ethyl (B) <10 <10 mg/dL    Comment: (NOTE) Lowest detectable limit for serum alcohol is 10 mg/dL. For medical purposes only. Performed at [facility], [place] [address], [place], [place] [address]   Salicylate level     Status: None   Collection Time: 12/24/17 10:56 PM  Result Value Ref Range   Salicylate Lvl <6.1 2.8 - 30.0 mg/dL    Comment: Performed at [facility], [place] [address], [place], [place] [address]  Acetaminophen level     Status: Abnormal   Collection Time: 12/24/17 10:56 PM  Result Value Ref Range   Acetaminophen (Tylenol), Serum <10 (L) 10 - 30 ug/mL    Comment: (NOTE) Therapeutic concentrations vary significantly. A range of 10-30 ug/mL  may be an effective concentration for many patients. However, some  are best treated at concentrations outside of this range. Acetaminophen concentrations >150 ug/mL at 4 hours after ingestion  and >50 ug/mL at 12 hours after ingestion are often associated with  toxic reactions. Performed at [facility], [place] [address] [place], [place] [address]   I-Stat Beta hCG blood, ED ([facility], [facility], [facility] only)     Status: None   Collection Time: 12/24/17 11:04 PM  Result Value Ref Range   I-stat hCG, quantitative <5.0 <5 mIU/mL   Comment 3            Comment:   GEST. AGE      CONC.  (mIU/mL)   <=1 WEEK        5 - 50     2 WEEKS       50 - 500     3 WEEKS       100 - 10,000     4 WEEKS     1,000 - 30,000        FEMALE AND NON-PREGNANT FEMALE:     LESS THAN 5 mIU/mL   I-stat troponin, ED     Status: None   Collection Time: 12/24/17 11:05 PM  Result Value Ref Range   Troponin i, poc 0.00 0.00 - 0.08 ng/mL   Comment 3            Comment: Due to the release kinetics of cTnI, a negative result within the first hours of the onset of symptoms does not rule out myocardial infarction with certainty. If myocardial infarction is still suspected, repeat  the test at appropriate intervals.   I-stat chem 8, ed     Status: Abnormal   Collection Time: 12/24/17 11:06 PM  Result Value Ref Range   Sodium 141 135 - 145 mmol/L   Potassium 3.9 3.5 - 5.1 mmol/L   Chloride 108 98 - 111 mmol/L   BUN 18 6 - 20 mg/dL   Creatinine, Ser 1.20 (H) 0.44 - 1.00 mg/dL   Glucose, Bld 179 (H) 70 - 99 mg/dL   Calcium, Ion 1.10 (L) 1.15 - 1.40 mmol/L   TCO2 24 22 - 32 mmol/L   Hemoglobin 12.6 12.0 - 15.0 g/dL   HCT 37.0 36.0 - 46.0 %  I-Stat CG4 Lactic Acid, ED     Status: Abnormal   Collection Time:  12/24/17 11:06 PM  Result Value Ref Range   Lactic Acid, Venous 2.40 (HH) 0.5 - 1.9 mmol/L   Comment NOTIFIED PHYSICIAN    Ct Head Wo Contrast  Result Date: 12/25/2017 CLINICAL DATA:  Patient was found down. Possible overdose. EXAM: CT HEAD WITHOUT CONTRAST TECHNIQUE: Contiguous axial images were obtained from the base of the skull through the vertex without intravenous contrast. COMPARISON:  None. FINDINGS: Brain: Mild cerebral atrophy for age. No ventricular dilatation. No mass effect or midline shift. No abnormal extra-axial fluid collections. Gray-white matter junctions are distinct. Basal cisterns are not effaced. No acute intracranial hemorrhage. Vascular: No hyperdense vessel or unexpected calcification. Skull: Calvarium appears intact. Sinuses/Orbits: Paranasal sinuses demonstrate mild mucosal thickening in the maxillary antra. No acute air-fluid levels. Hypoaeration of the mastoid air cells without opacification. Other: None. IMPRESSION: No acute intracranial abnormalities.  Mild cerebral atrophy. Electronically Signed   By: Lucienne Capers M.D.   On: 12/25/2017 00:35   Dg Chest Port 1 View  Result Date: 12/24/2017 CLINICAL DATA:  Patient found down at home.  Suspected overdose. EXAM: PORTABLE CHEST 1 VIEW COMPARISON:  None. FINDINGS: Shallow inspiration. Normal heart size and pulmonary vascularity. No focal airspace disease or consolidation in the lungs. No  blunting of costophrenic angles. No pneumothorax. Mediastinal contours appear intact. IMPRESSION: No active disease. Electronically Signed   By: Lucienne Capers M.D.   On: 12/24/2017 23:04   None  Pending Labs Unresulted Labs (From admission, onward)    Start     Ordered   12/25/17 0500  HIV antibody (Routine Testing)  Tomorrow morning,   R     12/25/17 0201   12/25/17 9937  Basic metabolic panel  Tomorrow morning,   R     12/25/17 0201   12/25/17 0500  Hepatic function panel  Tomorrow morning,   R     12/25/17 0201   12/25/17 0500  Magnesium  Tomorrow morning,   R     12/25/17 0201   12/25/17 0500  CBC WITH DIFFERENTIAL  Tomorrow morning,   R     12/25/17 0201   12/25/17 0219  Pathologist smear review  STAT,   R     12/25/17 0218   12/25/17 0219  MRSA PCR Screening  Once,   R     12/25/17 0219   12/25/17 0206  Culture, blood (routine x 2)  BLOOD CULTURE X 2,   R     12/25/17 0205   12/25/17 0203  Lactic acid, plasma  STAT Now then every 3 hours,   R     12/25/17 0202   12/25/17 0203  Blood gas, arterial  STAT,   R     12/25/17 0203   12/25/17 0201  Troponin I  Now then every 6 hours,   R     12/25/17 0201   12/25/17 0201  TSH  Once,   R     12/25/17 0201   12/25/17 0201  Hemoglobin A1c  Once,   R     12/25/17 0201   12/24/17 2326  Urine culture  STAT,   STAT     12/24/17 2326          Vitals/Pain Today's Vitals   12/25/17 0000 12/25/17 0030 12/25/17 0100 12/25/17 0130  BP: (!) 145/103 120/80 131/71 128/69  Pulse: (!) 113 89 90 92  Resp: (!) 36 (!) 28 (!) 30 (!) 29  Temp:  98.1 F (36.7 C) 98.1 F (36.7 C) 98.2 F (  36.8 C)  TempSrc:      SpO2: 97% 91% 91% 93%  PainSc:        Isolation Precautions No active isolations  Medications Medications  haloperidol lactate (HALDOL) 5 MG/ML injection (  Not Given 12/24/17 2315)  acetaminophen (TYLENOL) tablet 650 mg (has no administration in time range)    Or  acetaminophen (TYLENOL) suppository 650 mg (has no  administration in time range)  0.9 %  sodium chloride infusion (has no administration in time range)  LORazepam (ATIVAN) injection 2-3 mg (has no administration in time range)  folic acid injection 1 mg (has no administration in time range)  thiamine (B-1) injection 100 mg (has no administration in time range)  cefTRIAXone (ROCEPHIN) 1 g in sodium chloride 0.9 % 100 mL IVPB (has no administration in time range)  naloxone [facility]) 2 MG/2ML injection (2 mg  Given 12/24/17 2208)  sodium chloride 0.9 % bolus 500 mL (0 mLs Intravenous Stopped 12/24/17 2335)  LORazepam (ATIVAN) 2 MG/ML injection (2 mg  Given 12/24/17 2229)  haloperidol lactate (HALDOL) injection 5 mg (5 mg Intravenous Given 12/24/17 2241)  LORazepam (ATIVAN) 2 MG/ML injection (2 mg  Given 12/24/17 2315)  cefTRIAXone (ROCEPHIN) 1 g in sodium chloride 0.9 % 100 mL IVPB (0 g Intravenous Stopped 12/25/17 0118)  LORazepam (ATIVAN) 2 MG/ML injection (  Given 12/25/17 0011)  sodium chloride 0.9 % bolus 1,000 mL (1,000 mLs Intravenous Transfusing/Transfer 12/25/17 0132)    Mobility walks

## 2017-12-26 DIAGNOSIS — T50901A Poisoning by unspecified drugs, medicaments and biological substances, accidental (unintentional), initial encounter: Secondary | ICD-10-CM

## 2017-12-26 DIAGNOSIS — T6591XA Toxic effect of unspecified substance, accidental (unintentional), initial encounter: Secondary | ICD-10-CM | POA: Diagnosis not present

## 2017-12-26 DIAGNOSIS — G9340 Encephalopathy, unspecified: Secondary | ICD-10-CM

## 2017-12-26 DIAGNOSIS — D696 Thrombocytopenia, unspecified: Secondary | ICD-10-CM

## 2017-12-26 LAB — CBC
HCT: 36.3 % (ref 36.0–46.0)
Hemoglobin: 11.2 g/dL — ABNORMAL LOW (ref 12.0–15.0)
MCH: 30.2 pg (ref 26.0–34.0)
MCHC: 30.9 g/dL (ref 30.0–36.0)
MCV: 97.8 fL (ref 80.0–100.0)
Platelets: 121 10*3/uL — ABNORMAL LOW (ref 150–400)
RBC: 3.71 MIL/uL — ABNORMAL LOW (ref 3.87–5.11)
RDW: 14.5 % (ref 11.5–15.5)
WBC: 9.8 10*3/uL (ref 4.0–10.5)
nRBC: 0 % (ref 0.0–0.2)

## 2017-12-26 LAB — BASIC METABOLIC PANEL
Anion gap: 8 (ref 5–15)
BUN: 14 mg/dL (ref 6–20)
CO2: 25 mmol/L (ref 22–32)
Calcium: 8.4 mg/dL — ABNORMAL LOW (ref 8.9–10.3)
Chloride: 109 mmol/L (ref 98–111)
Creatinine, Ser: 1.03 mg/dL — ABNORMAL HIGH (ref 0.44–1.00)
GFR calc Af Amer: >60 mL/min (ref >60)
GFR calc non Af Amer: >60 mL/min (ref >60)
Glucose, Bld: 99 mg/dL (ref 70–99)
Potassium: 4.2 mmol/L (ref 3.5–5.1)
Sodium: 142 mmol/L (ref 135–145)

## 2017-12-26 LAB — GLUCOSE, CAPILLARY
Glucose-Capillary: 90 mg/dL (ref 70–99)
Glucose-Capillary: 97 mg/dL (ref 70–99)

## 2017-12-26 NOTE — Discharge Summary (Addendum)
Physician Discharge Summary  Jade Burgess WUX:[id number] DOB: [birth year] DOA: 12/24/2017  PCP: Patient, No Pcp Per  Admit date: 12/24/2017 Discharge date: 12/26/2017  Admitted From: Home  Disposition:  Home   Recommendations for Outpatient Follow-up:  1. Follow up with new PCP as soon as able 2. Please obtain CBC within 3 months   Home Health: None  Equipment/Devices: None  Discharge Condition: Good  CODE STATUS: FULL Diet recommendation: Regular  Brief/Interim Summary: Jade Burgess is a 52 y.o. F with hx IVDU presented with being found down, unconscious.  Per notes, son found her at her house, obtunded. EMS gave Narcan after which she woke up, was aggressive, given Haldol.  UDS positive for opiates, benzos only.  Aceta/salic/alcohol negative.  Lactic acid elevated, WBC 11.7K only, no fever, no reports of recent malaise, illness.  UA non-descript, CXR clear.   Of note, patient's boyfriend found at same address, deceased from apparent overdose.     PRINCIPAL HOSPITAL DIAGNOSIS: Unintentional overdose, unknown substance    Discharge Diagnoses:   Suspected overdose Patient admitted and treated supportively.  Airway and breathing stable.  Patient slowly became more alert.  She denied suicidal ideation, any intent for self harm.  Son agreed no suspicion for self harm.  Patient able to contract for safety.    Patient has history of opiate use disorder, previously on Suboxone maintenance, none here.  Was evasive about substance use, reported amnesia for events leading up to unresponsive episode.      Leukocytosis Presumed reactive. With normal UA, urine culture minimal growth, CXR negative and blood cultures negative, as well as context of positive UDS, spontaneous resolution of encephalopathy without antibiotics and normal fever curve, no reason to suspect infection.  Substance use disorder Absolute abstinence of opiates, amphetamines, cocaine and other illicit drugs  recommended.  Grief support programs provided.  Anemia Thrombocytopenia Likely related to alcohol use.  Unclear cause of anemia, however.  Needs follow up.  Patient instructed.  She plans to obtain PCP in next month, will get complete blood count in follow up.        Discharge Instructions  Discharge Instructions    Diet general   Complete by:  As directed    Discharge instructions   Complete by:  As directed    From Dr. Maryfrances Burgess: You were admitted for possible overdose. You had no findings or lab tests to suggest infection, nor seizure, nor stroke.  From a medical standpoint, it is most important that you avoid opiates (like oxycodone, heroin, morphine, etc), as well as other illicit drugs.  When used together, medicines like morphine and medicines like Valium or Xanax can be very lethal.  If you find that you have trouble controlling use of these medicines, it is necessary that you seek substance use counseling at one of the facilities provided to you by our social worker.  When you establish with your new primary care doctor, ask them to check your blood level (your "hemoglobin") because you were anemic here in the hospital.   Increase activity slowly   Complete by:  As directed      Allergies as of 12/26/2017   No Known Allergies     Medication List    TAKE these medications   acetaminophen 500 MG tablet Commonly known as:  TYLENOL Take 1,000 mg by mouth every 6 (six) hours as needed for moderate pain.       No Known Allergies  Consultations:  None   Procedures/Studies: Ct Head  Wo Contrast  Result Date: 12/25/2017 CLINICAL DATA:  Patient was found down. Possible overdose. EXAM: CT HEAD WITHOUT CONTRAST TECHNIQUE: Contiguous axial images were obtained from the base of the skull through the vertex without intravenous contrast. COMPARISON:  None. FINDINGS: Brain: Mild cerebral atrophy for age. No ventricular dilatation. No mass effect or midline shift. No abnormal  extra-axial fluid collections. Gray-white matter junctions are distinct. Basal cisterns are not effaced. No acute intracranial hemorrhage. Vascular: No hyperdense vessel or unexpected calcification. Skull: Calvarium appears intact. Sinuses/Orbits: Paranasal sinuses demonstrate mild mucosal thickening in the maxillary antra. No acute air-fluid levels. Hypoaeration of the mastoid air cells without opacification. Other: None. IMPRESSION: No acute intracranial abnormalities.  Mild cerebral atrophy. Electronically Signed   By: Burman Nieves M.D.   On: 12/25/2017 00:35   Dg Chest Port 1 View  Result Date: 12/24/2017 CLINICAL DATA:  Patient found down at home.  Suspected overdose. EXAM: PORTABLE CHEST 1 VIEW COMPARISON:  None. FINDINGS: Shallow inspiration. Normal heart size and pulmonary vascularity. No focal airspace disease or consolidation in the lungs. No blunting of costophrenic angles. No pneumothorax. Mediastinal contours appear intact. IMPRESSION: No active disease. Electronically Signed   By: Burman Nieves M.D.   On: 12/24/2017 23:04       Subjective: Feeling well.  Alert and oriented.  Appetite good. AMbulating without assistance.  No headahce, cough, dysuria, dyspnea, sputum, flank pain, abdominal pain.  Discharge Exam: Vitals:   12/25/17 2134 12/26/17 0434  BP: 118/75 128/72  Pulse: 85 85  Resp: 16 16  Temp: 98.8 F (37.1 C) 98.9 F (37.2 C)  SpO2: 96% 97%   Vitals:   12/25/17 1800 12/25/17 1838 12/25/17 2134 12/26/17 0434  BP: 119/63 134/77 118/75 128/72  Pulse: 85 90 85 85  Resp: (!) 26  16 16   Temp:  98 F (36.7 C) 98.8 F (37.1 C) 98.9 F (37.2 C)  TempSrc:   Oral Oral  SpO2: 95% 95% 96% 97%  Weight:      Height:        General: Pt is alert, awake, not in acute distress, lying in bed Cardiovascular: RRR, nl S1-S2, no murmurs appreciated.   No LE edema.   Respiratory: Normal respiratory rate and rhythm.  CTAB without rales or wheezes. Abdominal: Abdomen soft  and non-tender.  No distension or HSM.   Neuro/Psych: Strength symmetric in upper and lower extremities.  Judgment and insight appear normal.   The results of significant diagnostics from this hospitalization (including imaging, microbiology, ancillary and laboratory) are listed below for reference.     Microbiology: Recent Results (from the past 240 hour(s))  Urine culture     Status: Abnormal (Preliminary result)   Collection Time: 12/24/17 11:26 PM  Result Value Ref Range Status   Specimen Description   Final    URINE, RANDOM Performed at [facility], [address], [place], [place] [address]    Special Requests   Final    NONE Performed at [facility], [address] [place], [place] [address]    Culture 60,000 COLONIES/mL ESCHERICHIA COLI (A)  Final   Report Status PENDING  Incomplete  MRSA PCR Screening     Status: None   Collection Time: 12/25/17  2:19 AM  Result Value Ref Range Status   MRSA by PCR NEGATIVE NEGATIVE Final    Comment:        The GeneXpert MRSA Assay (FDA approved for NASAL specimens only), is one component of  a comprehensive MRSA colonization surveillance program. It is not intended to diagnose MRSA infection nor to guide or monitor treatment for MRSA infections. Performed at [facility], [address], [place], [place] [address]   Culture, blood (routine x 2)     Status: None (Preliminary result)   Collection Time: 12/25/17  2:50 AM  Result Value Ref Range Status   Specimen Description   Final    BLOOD RIGHT ANTECUBITAL Performed at [facility], [address], [place], [place] [address]    Special Requests   Final    BOTTLES DRAWN AEROBIC ONLY Blood Culture adequate volume Performed at [facility], [address], [place], [place] [address]    Culture   Final    NO GROWTH 1 DAY Performed at [facility], [address],  [place], [place] [address]    Report Status PENDING  Incomplete  Culture, blood (routine x 2)     Status: None (Preliminary result)   Collection Time: 12/25/17  2:50 AM  Result Value Ref Range Status   Specimen Description   Final    BLOOD LEFT ANTECUBITAL Performed at [facility], [address] [place], [place] [address]    Special Requests   Final    BOTTLES DRAWN AEROBIC ONLY Blood Culture adequate volume Performed at [facility], [address], [place], [place] [address]    Culture   Final    NO GROWTH 1 DAY Performed at [facility], [address] [place], [place] [address]    Report Status PENDING  Incomplete     Labs: BNP (last 3 results) No results for input(s): BNP in the last 8760 hours. Basic Metabolic Panel: Recent Labs  Lab 12/24/17 2256 12/24/17 2306 12/25/17 0250 12/26/17 0431  NA 132* 141 139 142  K 4.1 3.9 4.4 4.2  CL 98 108 105 109  CO2 22  --  24 25  GLUCOSE 130* 179* 137* 99  BUN 17 18 19 14   CREATININE 1.05* 1.20* 1.13* 1.03*  CALCIUM 8.0*  --  8.7* 8.4*  MG  --   --  1.7  --    Liver Function Tests: Recent Labs  Lab 12/24/17 2256 12/25/17 0250  AST 25 31  ALT 20 24  ALKPHOS 65 75  BILITOT 0.5 0.6  PROT 6.0* 6.8  ALBUMIN 2.9* 3.3*   No results for input(s): LIPASE, AMYLASE in the last 168 hours. Recent Labs  Lab 12/25/17 0839  AMMONIA 28   CBC: Recent Labs  Lab 12/24/17 2256 12/24/17 2306 12/25/17 0250 12/26/17 0431  WBC 11.7*  --  14.3* 9.8  NEUTROABS 10.2*  --  12.8*  --   HGB 12.4 12.6 11.9* 11.2*  HCT 40.3 37.0 38.3 36.3  MCV 97.6  --  98.7 97.8  PLT 113*  --  142* 121*   Cardiac Enzymes: Recent Labs  Lab 12/25/17 0250 12/25/17 0839 12/25/17 1432  TROPONINI 0.04* 0.03* <0.03   BNP: Invalid input(s): POCBNP CBG: Recent Labs  Lab 12/24/17 2217 12/25/17 1132 12/26/17 0040 12/26/17 0543  GLUCAP 182* 84 97 90   D-Dimer No results for input(s): DDIMER in the  last 72 hours. Hgb A1c Recent Labs    12/25/17 0250  HGBA1C 4.9   Lipid Profile No results for input(s): CHOL, HDL, LDLCALC, TRIG, CHOLHDL, LDLDIRECT in the last 72 hours. Thyroid function studies Recent Labs    12/25/17 0250  TSH 4.478  Anemia work up No results for input(s): VITAMINB12, FOLATE, FERRITIN, TIBC, IRON, RETICCTPCT in the last 72 hours. Urinalysis    Component Value Date/Time   COLORURINE AMBER (A) 12/24/2017 2226   APPEARANCEUR HAZY (A) 12/24/2017 2226   LABSPEC 1.021 12/24/2017 2226   PHURINE 5.0 12/24/2017 2226   GLUCOSEU NEGATIVE 12/24/2017 2226   HGBUR NEGATIVE 12/24/2017 2226   BILIRUBINUR NEGATIVE 12/24/2017 2226   KETONESUR 5 (A) 12/24/2017 2226   PROTEINUR 30 (A) 12/24/2017 2226   NITRITE NEGATIVE 12/24/2017 2226   LEUKOCYTESUR MODERATE (A) 12/24/2017 2226   Sepsis Labs Invalid input(s): PROCALCITONIN,  WBC,  LACTICIDVEN Microbiology Recent Results (from the past 240 hour(s))  Urine culture     Status: Abnormal (Preliminary result)   Collection Time: 12/24/17 11:26 PM  Result Value Ref Range Status   Specimen Description   Final    URINE, RANDOM Performed at [facility], [address], [place], [place] [address]    Special Requests   Final    NONE Performed at [facility], [address], [place], [place] [address]    Culture 60,000 COLONIES/mL ESCHERICHIA COLI (A)  Final   Report Status PENDING  Incomplete  MRSA PCR Screening     Status: None   Collection Time: 12/25/17  2:19 AM  Result Value Ref Range Status   MRSA by PCR NEGATIVE NEGATIVE Final    Comment:        The GeneXpert MRSA Assay (FDA approved for NASAL specimens only), is one component of a comprehensive MRSA colonization surveillance program. It is not intended to diagnose MRSA infection nor to guide or monitor treatment for MRSA infections. Performed at [facility], [address] [place], [place]  [address]   Culture, blood (routine x 2)     Status: None (Preliminary result)   Collection Time: 12/25/17  2:50 AM  Result Value Ref Range Status   Specimen Description   Final    BLOOD RIGHT ANTECUBITAL Performed at [facility], [address], [place], [place] [address]    Special Requests   Final    BOTTLES DRAWN AEROBIC ONLY Blood Culture adequate volume Performed at [facility], [address], [place], [place] [address]    Culture   Final    NO GROWTH 1 DAY Performed at [facility], [address], [place], [place] [address]    Report Status PENDING  Incomplete  Culture, blood (routine x 2)     Status: None (Preliminary result)   Collection Time: 12/25/17  2:50 AM  Result Value Ref Range Status   Specimen Description   Final    BLOOD LEFT ANTECUBITAL Performed at [facility], [address] [place], [place] [address]    Special Requests   Final    BOTTLES DRAWN AEROBIC ONLY Blood Culture adequate volume Performed at [facility], [address] [place], [place] [address]    Culture   Final    NO GROWTH 1 DAY Performed at [facility], [address], [place], [place] [address]    Report Status PENDING  Incomplete     Time coordinating discharge: 40 minutes       SIGNED:   Alberteen Sam, MD  [facility] 12/26/2017, 2:09 PM

## 2017-12-26 NOTE — Progress Notes (Signed)
Pt admitted for accidental overdose - per report her partner died at scene. Met with pt at bedside- she was alert/oriented and welcoming of CSW involvement. Reports, "I just don't even remember how this happened. I don't remember even taking it or getting to the hospital. I can't believe this happened- he loved me unconditionally." CSW validated pt's emotions and provided support. Pt interested in grief counseling and support groups- CSW provided area resources for hospice counseling and for [employer]. She reports her family is supportive and she will be returning home with her son. States she is motivated to stop drug use but "too overwhelmed" to discuss treatment options. CSW encouraged her to follow up with resources given. Pt thanked hospital staff for her care.  Sharren Bridge, MSW, LCSW Clinical Social Work 12/26/2017 [phone]

## 2017-12-27 LAB — URINE CULTURE: Culture: 60000 — AB

## 2017-12-30 LAB — CULTURE, BLOOD (ROUTINE X 2)
Culture: NO GROWTH
Culture: NO GROWTH
Special Requests: ADEQUATE
Special Requests: ADEQUATE

## 2019-01-03 ENCOUNTER — Other Ambulatory Visit: Payer: Self-pay | Admitting: Family

## 2019-01-03 DIAGNOSIS — Z1231 Encounter for screening mammogram for malignant neoplasm of breast: Secondary | ICD-10-CM

## 2019-02-27 ENCOUNTER — Other Ambulatory Visit: Payer: Self-pay

## 2019-02-27 ENCOUNTER — Ambulatory Visit
Admission: RE | Admit: 2019-02-27 | Discharge: 2019-02-27 | Disposition: A | Discharge status: Home / Self Care | Payer: Medicare Other | Source: Ambulatory Visit | Attending: Family | Admitting: Family

## 2019-02-27 DIAGNOSIS — Z1231 Encounter for screening mammogram for malignant neoplasm of breast: Secondary | ICD-10-CM

## 2019-03-05 ENCOUNTER — Other Ambulatory Visit: Payer: Self-pay | Admitting: Family

## 2019-03-05 ENCOUNTER — Ambulatory Visit
Admission: RE | Admit: 2019-03-05 | Discharge: 2019-03-05 | Disposition: A | Discharge status: Home / Self Care | Payer: Medicare Other | Source: Ambulatory Visit | Attending: Family | Admitting: Family

## 2019-03-05 DIAGNOSIS — M25461 Effusion, right knee: Secondary | ICD-10-CM

## 2019-03-12 ENCOUNTER — Encounter: Payer: Self-pay | Admitting: Orthopaedic Surgery

## 2019-03-12 ENCOUNTER — Ambulatory Visit (INDEPENDENT_AMBULATORY_CARE_PROVIDER_SITE_OTHER): Payer: Medicare Other | Admitting: Orthopaedic Surgery

## 2019-03-12 ENCOUNTER — Other Ambulatory Visit: Payer: Self-pay

## 2019-03-12 DIAGNOSIS — G8929 Other chronic pain: Secondary | ICD-10-CM

## 2019-03-12 DIAGNOSIS — M25561 Pain in right knee: Secondary | ICD-10-CM | POA: Diagnosis not present

## 2019-03-12 NOTE — Progress Notes (Signed)
Office Visit Note   Patient: Jade Burgess           Date of Birth: Aug 14, 1965           MRN: [id number] Visit Date: 03/12/2019              Requested by: No referring provider defined for this encounter. PCP: Patient, No Pcp Per   Assessment & Plan: Visit Diagnoses:  1. Chronic pain of right knee     Plan: Chronic history of problems referable to the right knee with mechanical symptoms and possibly a loose body or an unstable meniscal tear.  Will order MRI scan.  Follow-Up Instructions: Return After MRI scan right knee.   Orders:  Orders Placed This Encounter  Procedures  . MR Knee Right w/o contrast   No orders of the defined types were placed in this encounter.     Procedures: No procedures performed   Clinical Data: No additional findings.   Subjective: Chief Complaint  Patient presents with  . Right Knee - Pain  Patient presents today with right knee pain. Several years ago she was stepping onto a porch and her leg fell through. She has had pain in her knee since. She also fell a few days ago while climbing stairs when her right knee gave way. She has a large bruise anteriorly. She had arthroscopic surgery following that injury. Her knee gives way. Her pain is located more anteriorly. She does states that it swells. She does not take anything for pain in her knee, but does take hydrocodone and gabapentin for her back pain. She has been referred to a neurosurgeon her back and waiting for a call to schedule. She had x-rays taken on 03/05/2019 and they are in the PACS system.  Prior history of right knee arthroscopy.  Jade Burgess had a hard time describing her past history and her symptoms but after her arthroscopy she still had episodes of her knee giving way with something feeling as if it were "loose".  She may have had an exacerbation of all of the above with a recent fall last week where she has had some bruising in the anterior aspect of her knee.  She was told after  the knee arthroscopy years ago that her knee was "eaten up with arthritis".  I did review the films of her knee recently and I did not see much change in terms of arthritis.  The joint spaces are well-maintained.  No ectopic calcification  HPI  Review of Systems   Objective: Vital Signs: Ht 5\' 2"  (1.575 m)   Wt 267 lb (121.1 kg)   LMP  (LMP Unknown)   BMI 48.83 kg/m   Physical Exam Constitutional:      Appearance: She is well-developed.  Eyes:     Pupils: Pupils are equal, round, and reactive to light.  Pulmonary:     Effort: Pulmonary effort is normal.  Skin:    General: Skin is warm and dry.  Neurological:     Mental Status: She is alert and oriented to person, place, and time.  Psychiatric:        Behavior: Behavior normal.     Ortho Exam right knee was large.  There was some ecchymosis anteriorly from her recent fall.  I could not tell if there was an effusion.  Some minimal patellar crepitation but no apprehension with motion.  Some medial joint tenderness and lateral joint tenderness.  Full extension actively and passively and about  95 degrees of flexion when her thigh would touch her calf.  Specialty Comments:  No specialty comments available.  Imaging: No results found.   PMFS History: Patient Active Problem List   Diagnosis Date Noted  . Pain in right knee 03/12/2019  . Acute encephalopathy 12/25/2017  . Drug overdose 12/25/2017  . ARF (acute renal failure) ([facility]) 12/25/2017  . Thrombocytopenia ([facility]) 12/25/2017   History reviewed. No pertinent past medical history.  Family History  Family history unknown: Yes    History reviewed. No pertinent surgical history. Social History   Occupational History  . Not on file  Tobacco Use  . Smoking status: Current Every Day Smoker    Packs/day: 0.50    Types: Cigarettes  . Smokeless tobacco: Never Used  Substance and Sexual Activity  . Alcohol use: Not on file  . Drug use: Not on file  . Sexual activity:  Not on file

## 2019-04-16 ENCOUNTER — Ambulatory Visit
Admission: RE | Admit: 2019-04-16 | Discharge: 2019-04-16 | Disposition: A | Discharge status: Home / Self Care | Payer: Medicare Other | Source: Ambulatory Visit | Attending: Orthopaedic Surgery | Admitting: Orthopaedic Surgery

## 2019-04-16 DIAGNOSIS — G8929 Other chronic pain: Secondary | ICD-10-CM

## 2019-04-22 ENCOUNTER — Other Ambulatory Visit: Payer: Self-pay

## 2019-04-22 ENCOUNTER — Ambulatory Visit (INDEPENDENT_AMBULATORY_CARE_PROVIDER_SITE_OTHER): Payer: Medicare Other | Admitting: Orthopaedic Surgery

## 2019-04-22 ENCOUNTER — Encounter: Payer: Self-pay | Admitting: Orthopaedic Surgery

## 2019-04-22 VITALS — Ht 62.0 in | Wt 267.0 lb

## 2019-04-22 DIAGNOSIS — G8929 Other chronic pain: Secondary | ICD-10-CM | POA: Diagnosis not present

## 2019-04-22 DIAGNOSIS — M25561 Pain in right knee: Secondary | ICD-10-CM

## 2019-04-22 NOTE — Progress Notes (Signed)
Office Visit Note   Patient: Jade Burgess           Date of Birth: [birth year]           MRN: [id number] Visit Date: 04/22/2019              Requested by: No referring provider defined for this encounter. PCP: Patient, No Pcp Per   Assessment & Plan: Visit Diagnoses:  1. Chronic pain of right knee   2. Morbid (severe) obesity due to excess calories [facility])     Plan: Jade Burgess had an MRI scan of her right knee demonstrating mild thinning of the articular cartilage of the lateral patella facet and in the medial compartment.  Normal lateral compartment.  Small effusion.  Tiny Baker's cyst.  No discrete meniscal tear.  The meniscal root was diminutive medially consistent with her old surgery over 20 years ago.  She did have prominent red marrow in the distal femur and proximal tibia and distal femur.   this is oftentimes consistent with anemia but her last labs were normal.  No loose bodies. Will try a course of physical therapy and then check her back in a month.  We may want to consider some lab work at that point.  Also had a discussion about her weight and how it may affect her arthritis over time Follow-Up Instructions: Return if symptoms worsen or fail to improve.   Orders:  Orders Placed This Encounter  Procedures  . Ambulatory referral to Physical Therapy   No orders of the defined types were placed in this encounter.     Procedures: No procedures performed   Clinical Data: No additional findings.   Subjective: Chief Complaint  Patient presents with  . Right Knee - Pain    MRI results  Patient presents today for follow up on her right knee. She had an MRI on 04/16/2019 and is here today for those results. Patient states that her knee is still very painful. She was taking hydrocodone but states that they were not strong enough, and has ran out.   HPI  Review of Systems   Objective: Vital Signs: Ht 5\' 2"  (1.575 m)   Wt 267 lb (121.1 kg)   LMP  (LMP Unknown)    BMI 48.83 kg/m   Physical Exam Constitutional:      Appearance: She is well-developed.  Eyes:     Pupils: Pupils are equal, round, and reactive to light.  Pulmonary:     Effort: Pulmonary effort is normal.  Skin:    General: Skin is warm and dry.  Neurological:     Mental Status: She is alert and oriented to person, place, and time.  Psychiatric:        Behavior: Behavior normal.     Ortho Exam large legs.  BMI 49.  Does have positive patella crepitation.  0 to 90 degrees of flexion at which point thigh touches calf.  No calf pain.  Motor exam intact.  No obvious effusion.  No particular medial lateral joint pain today.  No instability.  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 Specialty Comments:  No specialty comments available.  Imaging: No results found.   PMFS History: Patient Active Problem List   Diagnosis Date Noted  . Morbid (severe) obesity due to excess calories ([facility]) 04/22/2019  . Pain in right knee 03/12/2019  . Acute encephalopathy 12/25/2017  . Drug overdose 12/25/2017  . ARF (acute renal failure) (  [facility]) 12/25/2017  . Thrombocytopenia ([facility]) 12/25/2017   History reviewed. No pertinent past medical history.  Family History  Family history unknown: Yes    History reviewed. No pertinent surgical history. Social History   Occupational History  . Not on file  Tobacco Use  . Smoking status: Current Every Day Smoker    Packs/day: 0.50    Types: Cigarettes  . Smokeless tobacco: Never Used  Substance and Sexual Activity  . Alcohol use: Not on file  . Drug use: Not on file  . Sexual activity: Not on file

## 2019-05-08 ENCOUNTER — Ambulatory Visit: Payer: Medicare Other

## 2019-10-23 ENCOUNTER — Other Ambulatory Visit: Payer: Self-pay | Admitting: Nurse Practitioner

## 2019-10-23 DIAGNOSIS — B182 Chronic viral hepatitis C: Secondary | ICD-10-CM

## 2019-11-05 ENCOUNTER — Ambulatory Visit
Admission: RE | Admit: 2019-11-05 | Discharge: 2019-11-05 | Disposition: A | Discharge status: Home / Self Care | Payer: Medicare Other | Source: Ambulatory Visit | Attending: Nurse Practitioner | Admitting: Nurse Practitioner

## 2019-11-05 DIAGNOSIS — B182 Chronic viral hepatitis C: Secondary | ICD-10-CM

## 2020-01-09 ENCOUNTER — Encounter: Payer: Self-pay | Admitting: Physician Assistant

## 2020-02-03 ENCOUNTER — Ambulatory Visit (INDEPENDENT_AMBULATORY_CARE_PROVIDER_SITE_OTHER): Payer: Medicare Other | Admitting: Physician Assistant

## 2020-02-03 ENCOUNTER — Encounter: Payer: Self-pay | Admitting: Physician Assistant

## 2020-02-03 VITALS — BP 140/88 | HR 64 | Ht 62.0 in | Wt 276.2 lb

## 2020-02-03 DIAGNOSIS — Z1211 Encounter for screening for malignant neoplasm of colon: Secondary | ICD-10-CM

## 2020-02-03 DIAGNOSIS — K7460 Unspecified cirrhosis of liver: Secondary | ICD-10-CM

## 2020-02-03 DIAGNOSIS — Z1212 Encounter for screening for malignant neoplasm of rectum: Secondary | ICD-10-CM

## 2020-02-03 DIAGNOSIS — B182 Chronic viral hepatitis C: Secondary | ICD-10-CM | POA: Diagnosis not present

## 2020-02-03 NOTE — Progress Notes (Signed)
Chief Complaint: Chronic hepatitis C, screening for colorectal cancer:  HPI:    Jade Burgess is a 54 year old Caucasian female with a past medical history as listed below including chronic hepatitis C, who presents to clinic today as a referral from Annamarie Major, NP at the liver clinic for EGD and colonoscopy.    10/22/2019 patient seen in the liver clinic for her chronic hepatitis C.  That time they were seeing her for the first time and working her up to start antiviral therapy.  She had an ultrasound of the elastography at that time and this did show cirrhosis.  Due to this patient was recommended to have an EGD for variceal screening.    Today, the patient presents to clinic and tells me that she really has no problems but was told that she needed procedures.  Tells me she was diagnosed with hepatitis C years ago but has just now sought out treatment for this.      Denies any abdominal pain, heartburn, reflux, change in bowel habits, nausea or vomiting.  Past Medical History:  Diagnosis Date  . Hepatitis     Past Surgical History:  Procedure Laterality Date  . NO PAST SURGERIES      Current Outpatient Medications  Medication Sig Dispense Refill  . amLODipine (NORVASC) 10 MG tablet Take 10 mg by mouth daily.    Marland Kitchen gabapentin (NEURONTIN) 600 MG tablet Take 1 tablet by mouth in the morning and at bedtime.    Marland Kitchen HYDROcodone-acetaminophen (NORCO/VICODIN) 5-325 MG tablet Take 1 tablet by mouth every 8 (eight) hours as needed.     No current facility-administered medications for this visit.    Allergies as of 02/03/2020  . (No Known Allergies)    Family History  Problem Relation Age of Onset  . Colon cancer Sister     Social History   Socioeconomic History  . Marital status: Single    Spouse name: Not on file  . Number of children: Not on file  . Years of education: Not on file  . Highest education level: Not on file  Occupational History  . Not on file  Tobacco Use  . Smoking  status: Current Every Day Smoker    Packs/day: 0.50    Types: Cigarettes  . Smokeless tobacco: Never Used  Vaping Use  . Vaping Use: Never used  Substance and Sexual Activity  . Alcohol use: Not Currently  . Drug use: Never  . Sexual activity: Not on file  Other Topics Concern  . Not on file  Social History Narrative  . Not on file   Social Determinants of Health   Financial Resource Strain: Not on file  Food Insecurity: Not on file  Transportation Needs: Not on file  Physical Activity: Not on file  Stress: Not on file  Social Connections: Not on file  Intimate Partner Violence: Not on file    Review of Systems:    Constitutional: No weight loss, fever or chills Skin: No rash  Cardiovascular: No chest pain Respiratory: No SOB Gastrointestinal: See HPI and otherwise negative Genitourinary: No dysuria  Neurological: No headache, dizziness or syncope Musculoskeletal: No new muscle or joint pain Hematologic: No bleeding  Psychiatric: No history of depression or anxiety    Physical Exam:  Vital signs: BP 140/88   Pulse 64   Ht 5\' 2"  (1.575 m)   Wt 276 lb 3.2 oz (125.3 kg)   LMP  (LMP Unknown)   BMI 50.52 kg/m   Constitutional:  Caucasian female appears to be in NAD, Well developed, Well nourished, alert and cooperative Head:  Normocephalic and atraumatic. Eyes:   PEERL, EOMI. No icterus. Conjunctiva pink. Ears:  Normal auditory acuity. Neck:  Supple Throat: Oral cavity and pharynx without inflammation, swelling or lesion.  Respiratory: Respirations even and unlabored. Lungs clear to auscultation bilaterally.   No wheezes, crackles, or rhonchi.  Cardiovascular: Normal S1, S2. No MRG. Regular rate and rhythm. No peripheral edema, cyanosis or pallor.  Gastrointestinal:  Soft, nondistended, nontender. No rebound or guarding. Normal bowel sounds. No appreciable masses or hepatomegaly. Rectal:  Not performed.  Msk:  Symmetrical without gross deformities. Without edema,  no deformity or joint abnormality.  Neurologic:  Alert and  oriented x4;  grossly normal neurologically.  Skin:   Dry and intact without significant lesions or rashes. Psychiatric:  Demonstrates good judgement and reason without abnormal affect or behaviors.  Recent labs through liver clinic.  Assessment: 1.  Hepatitis C with cirrhosis: Recently sought treatment for this, had an ultrasound with elastography that was consistent with cirrhosis, recommendations for variceal screening from the liver clinic 2.  Screening for colorectal cancer: Patient is 69 and has never had a colonoscopy  Plan: 1.  Scheduled the patient for screening colonoscopy and EGD in the [facility] with Dr. Russella Dar.  Patient was provided a detailed list of risks for the procedures and agrees to proceed.  She will be Covid tested 2 days prior to time of procedures. 2.  Patient did tell me it may be hard for her to find a ride, told her to call back and change appointment if this is a problem for her. 3.  Patient to follow in clinic per recommendations after procedures as above.  Jade Meeker, PA-C [facility] 02/03/2020, 3:14 PM

## 2020-02-03 NOTE — Patient Instructions (Signed)
If you are age 54 or older, your body mass index should be between 23-30. Your Body mass index is 50.52 kg/m. If this is out of the aforementioned range listed, please consider follow up with your Primary Care Provider.  If you are age 54 or younger, your body mass index should be between 19-25. Your Body mass index is 50.52 kg/m. If this is out of the aformentioned range listed, please consider follow up with your Primary Care Provider.   It has been recommended to you by your physician that you have a(n)Colonoscopy and Endoscopy completed. Per your request, we did not schedule the procedure(s) today. Please contact our office at [phone] should you decide to have the procedure completed. You will be scheduled for a pre-visit and procedure at that time.  Thank you for choosing me and [facility].  Hyacinth Meeker, PA-C

## 2020-02-03 NOTE — Progress Notes (Signed)
Reviewed and agree with management plan.  Dagny Fiorentino T. Ximenna Fonseca, MD FACG [facility]  

## 2020-02-05 ENCOUNTER — Telehealth: Payer: Self-pay | Admitting: Physician Assistant

## 2020-02-05 NOTE — Telephone Encounter (Signed)
Patient called to ask if she could have her Covid test done where she gets her lab work done. I informed patient that her Covid test would be done at [facility] two days before her procedure. Patient asked me to give her a call back on Monday to Schedule procedure.

## 2020-02-05 NOTE — Telephone Encounter (Signed)
Pt is requesting a call back from a nurse, pt did not disclose any further information 

## 2020-02-10 ENCOUNTER — Telehealth: Payer: Self-pay | Admitting: Physician Assistant

## 2020-02-10 DIAGNOSIS — K7460 Unspecified cirrhosis of liver: Secondary | ICD-10-CM

## 2020-02-10 DIAGNOSIS — Z1211 Encounter for screening for malignant neoplasm of colon: Secondary | ICD-10-CM

## 2020-02-10 MED ORDER — NA SULFATE-K SULFATE-MG SULF 17.5-3.13-1.6 GM/177ML PO SOLN: 1.0000 | Freq: Once | ORAL | 0 refills | Status: AC

## 2020-02-10 NOTE — Telephone Encounter (Signed)
Patient called to schedule procedure. I scheduled procedure and Covid test. Instructions were mailed and prep sent in to pharmacy.

## 2020-03-09 ENCOUNTER — Telehealth: Payer: Self-pay

## 2020-03-09 NOTE — Telephone Encounter (Signed)
Called patient and let her know that procedure needed to be reschedule due to BMI. Patient was very upset but stated I could call her back and reschedule once hospital day became available.

## 2020-03-09 NOTE — Telephone Encounter (Signed)
-----   Message from Maura Crandall, [place] sent at 03/09/2020 12:37 PM EST ----- Regarding: FW: [facility] pt Hey, see Dr. Ardell Isaacs note. Patient saw Victorino Dike in the office in December.  ----- Message ----- From: Meryl Dare, MD Sent: 03/09/2020  12:03 PM EST To: Maura Crandall, CMA, [place], [place] Subject: Annell Greening: [facility] pt                                     Marchelle Folks,   BMI was 50.52 at Dec. office visit. If she were to lose 4-5 lbs her BMI at 5\' 2"  would be under 50 and we can document the updated BMI and proceed with colonoscopy and EGD at the [facility]. The other option is to schedule at [facility] during an outpatient block.   MS  ----- Message ----- From: [facility], CRNA Sent: 03/09/2020  11:20 AM EST To: 03/11/2020, MD, Meryl Dare, MD Subject: [facility] pt                                         Dr. Sherrilyn Rist,  This pt is scheduled with you on 1/24.  Unfortunately her BMI is > 50 so she does not qualify for care at [facility].  Thanks,   [facility]

## 2020-03-11 ENCOUNTER — Telehealth: Payer: Self-pay

## 2020-03-11 NOTE — Telephone Encounter (Signed)
Patient has been notified by [facility], CMA. See 03/09/2020 telephone encounter. Advised patient that they will call her once they have an available hospital date. Patient verbalized understanding.

## 2020-03-11 NOTE — Telephone Encounter (Signed)
-----   Message from Unk Lightning, [place] sent at 03/11/2020  8:41 AM EST ----- Regarding: FW: [facility] pt Can you make sure patient is aware - she will need to be rescheduled. You can ask if by chance she has lost any weight recently, if so she can come in and be re-weighed. Otherwise will need to go to hospital.  Thanks-JLL ----- Message ----- From: Meryl Dare, MD Sent: 03/09/2020  12:03 PM EST To: Maura Crandall, CMA, [place], [place] Subject: Annell Greening: [facility] pt                                     Marchelle Folks,   BMI was 50.52 at Dec. office visit. If she were to lose 4-5 lbs her BMI at 5\' 2"  would be under 50 and we can document the updated BMI and proceed with colonoscopy and EGD at the [facility]. The other option is to schedule at [facility] during an outpatient block.   MS  ----- Message ----- From: [facility], CRNA Sent: 03/09/2020  11:20 AM EST To: 03/11/2020, MD, Meryl Dare, MD Subject: [facility] pt                                         Dr. Sherrilyn Rist,  This pt is scheduled with you on 1/24.  Unfortunately her BMI is > 50 so she does not qualify for care at [facility].  Thanks,   [facility]

## 2020-03-15 ENCOUNTER — Encounter: Payer: Medicare Other | Admitting: Gastroenterology

## 2020-04-20 ENCOUNTER — Other Ambulatory Visit: Payer: Self-pay | Admitting: Nurse Practitioner

## 2020-04-20 DIAGNOSIS — B182 Chronic viral hepatitis C: Secondary | ICD-10-CM

## 2020-05-11 ENCOUNTER — Ambulatory Visit
Admission: RE | Admit: 2020-05-11 | Discharge: 2020-05-11 | Disposition: A | Discharge status: Home / Self Care | Payer: Medicare Other | Source: Ambulatory Visit | Attending: Nurse Practitioner | Admitting: Nurse Practitioner

## 2020-05-11 DIAGNOSIS — B182 Chronic viral hepatitis C: Secondary | ICD-10-CM

## 2021-12-01 IMAGING — DX DG KNEE COMPLETE 4+V*R*
4 series · 4 of 4 positions shown · non-contrast
Comparison: None.

CLINICAL DATA: Right knee effusion, chronic right knee pain

EXAM:
RIGHT KNEE - COMPLETE 4+ VIEW

[dg knee complete 4 views right (1 of 4)]
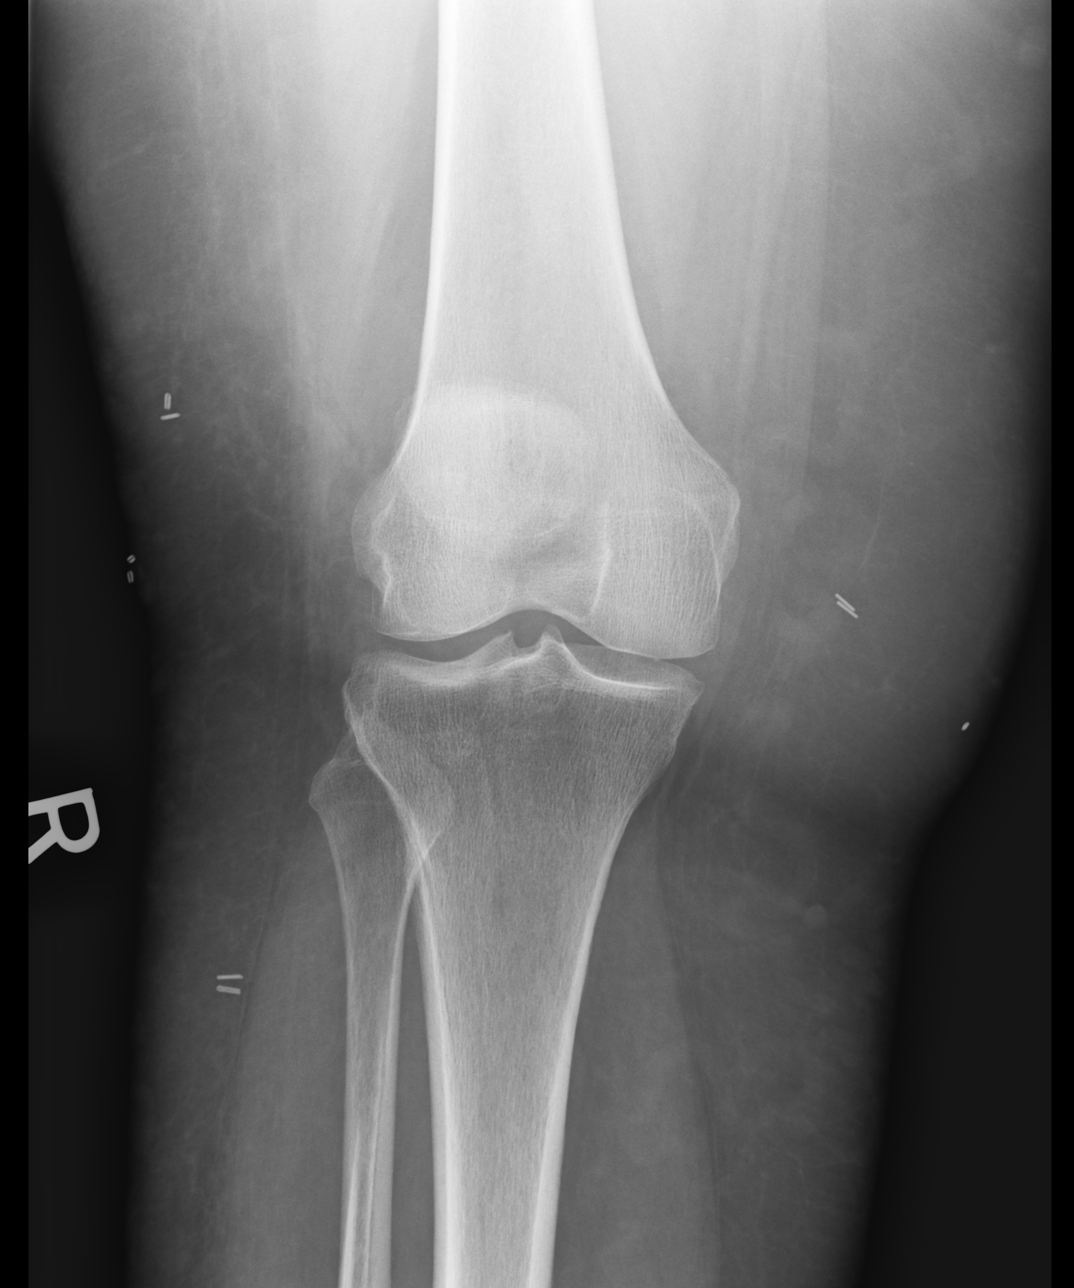

[dg knee complete 4 views right (2 of 4)]
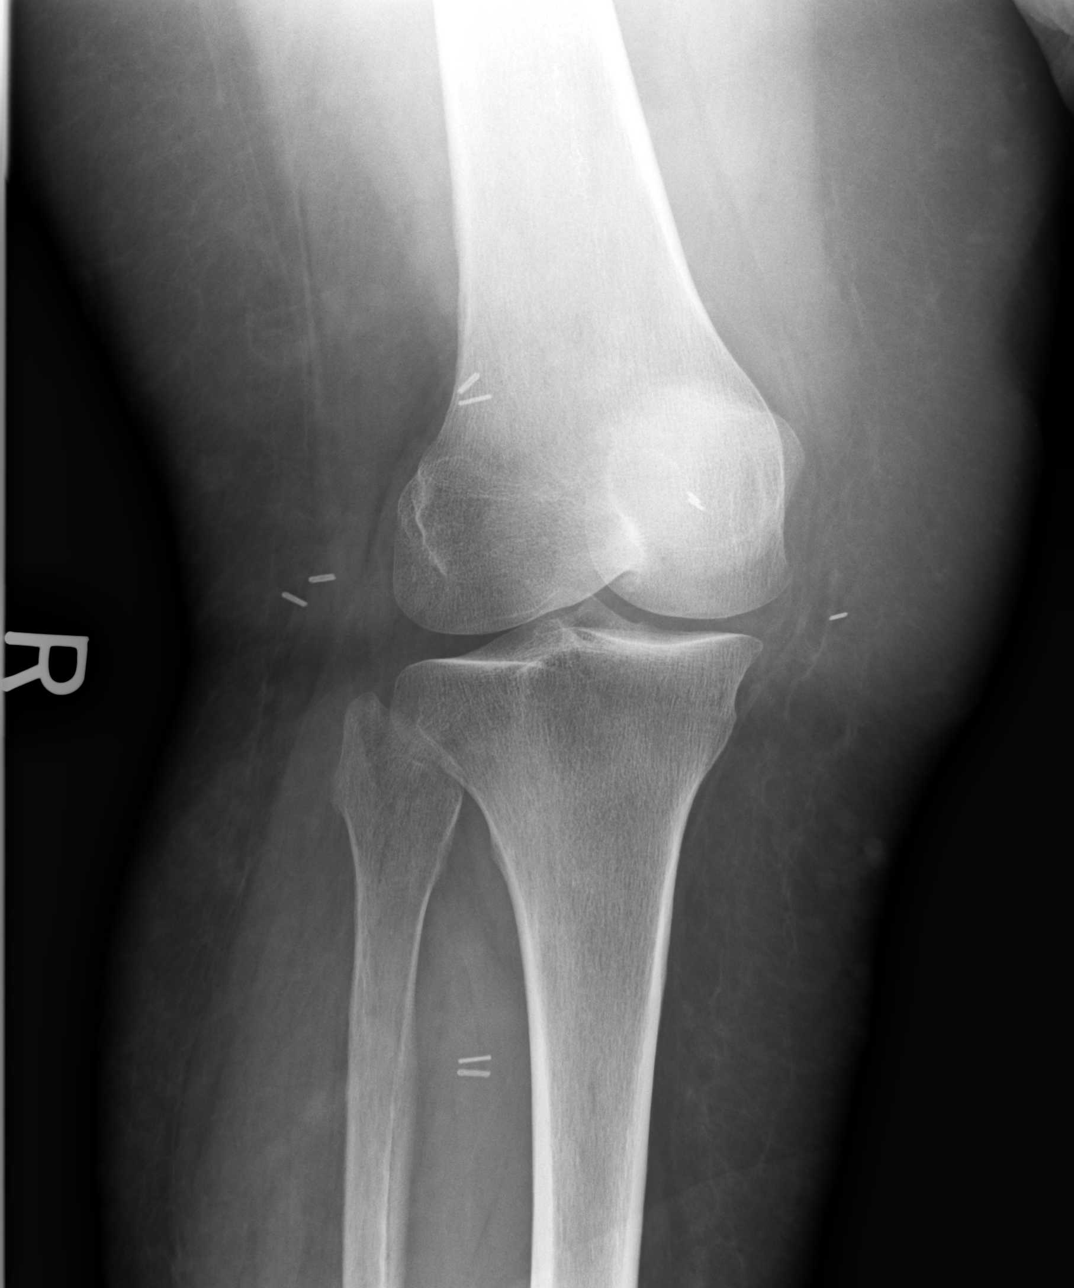

[dg knee complete 4 views right (3 of 4)]
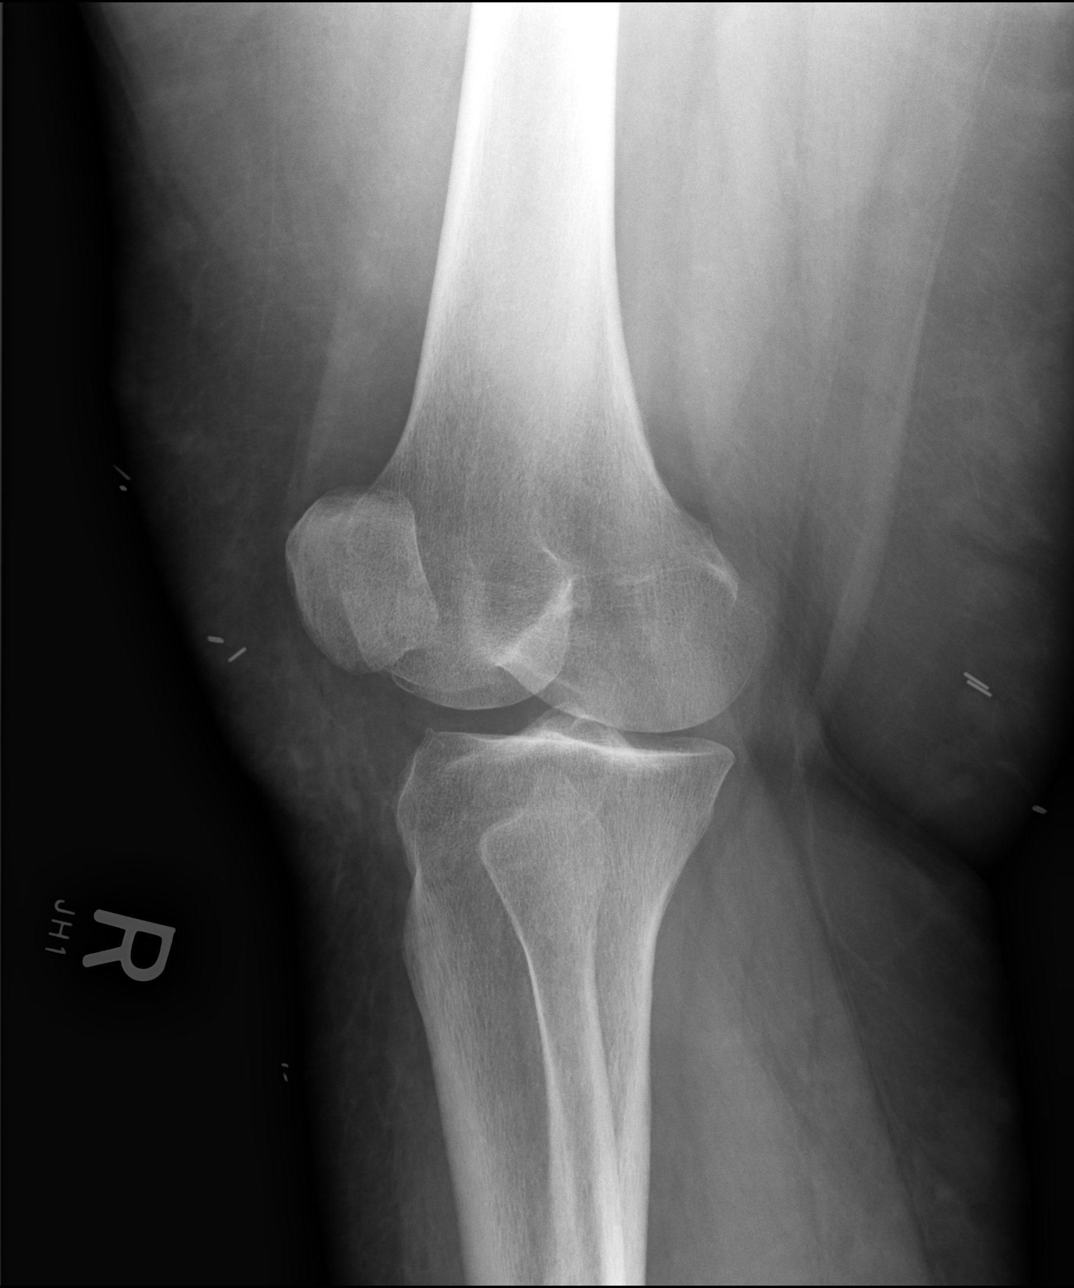

[dg knee complete 4 views right (4 of 4)]
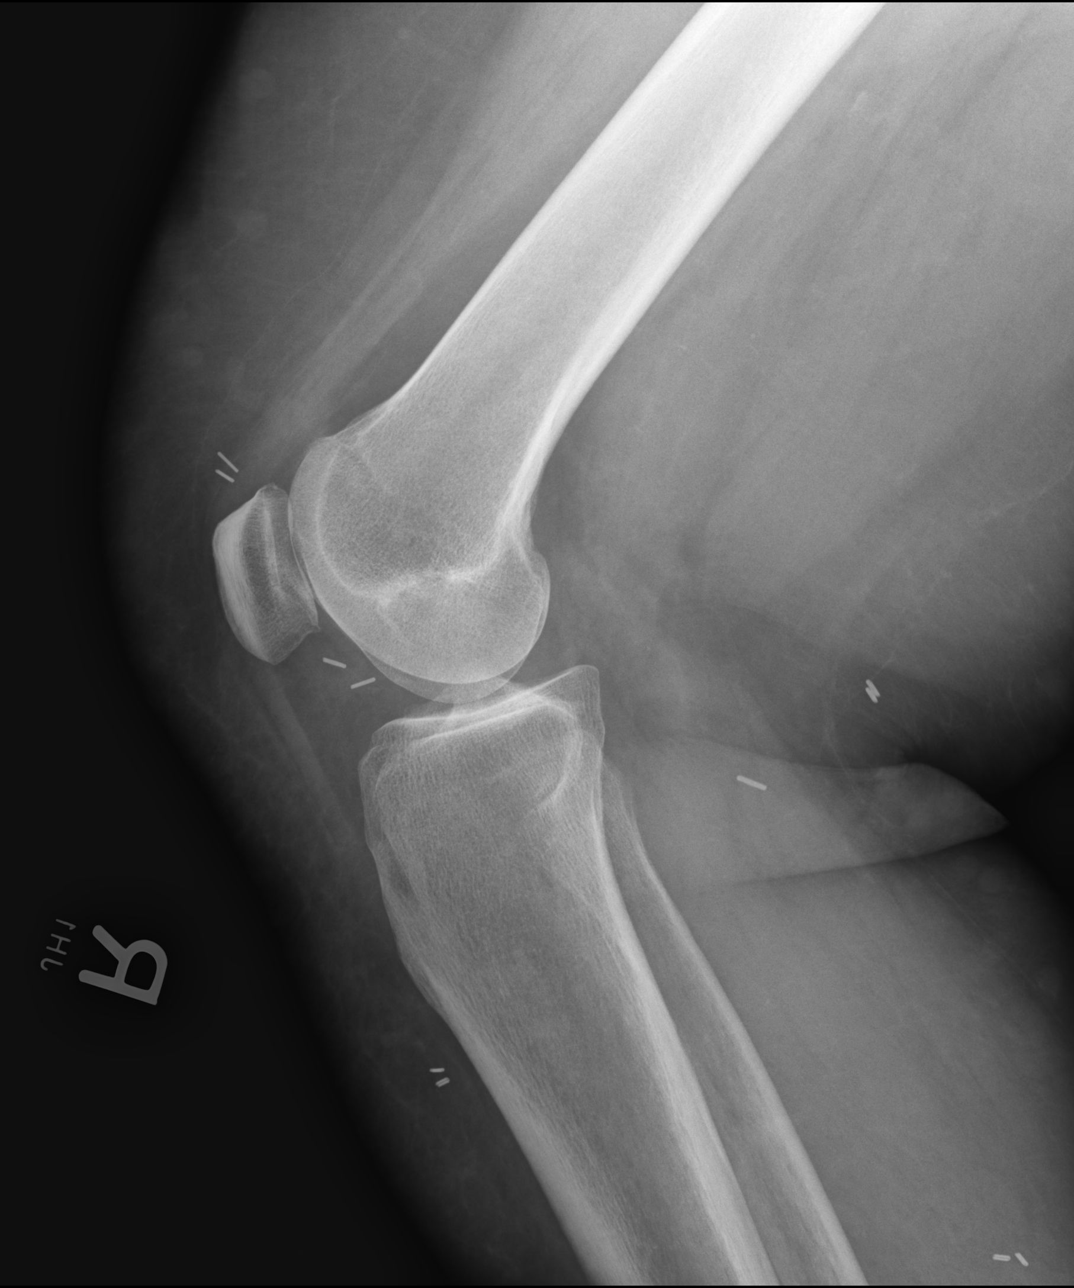

[4 of 4 positions shown; findings below may reference images not displayed]

FINDINGS: Multiple surgical clips project within the soft tissues of the right
knee. No joint effusion is present. No acute bony abnormality.
Specifically, no fracture, subluxation, or dislocation. Mild
edematous swelling of the lower extremity. Minimal vascular calcium
is seen posteriorly.
IMPRESSION: 1. No acute bony abnormality.  No joint effusion.
2. Mild edematous swelling of the lower extremity.
3. Atherosclerotic calcifications.

## 2021-12-06 ENCOUNTER — Other Ambulatory Visit: Payer: Self-pay | Admitting: Family

## 2021-12-06 ENCOUNTER — Ambulatory Visit
Admission: RE | Admit: 2021-12-06 | Discharge: 2021-12-06 | Disposition: A | Discharge status: Home / Self Care | Payer: Medicare Other | Source: Ambulatory Visit | Attending: Family | Admitting: Family

## 2021-12-06 DIAGNOSIS — M5440 Lumbago with sciatica, unspecified side: Secondary | ICD-10-CM
